# Patient Record
Sex: Male | Born: 1960
Health system: Southern US, Community
[De-identification: ages and names within clinical notes are randomized; demographics above are authoritative.]

## PROBLEM LIST (undated history)

## (undated) DIAGNOSIS — I4891 Unspecified atrial fibrillation: Secondary | ICD-10-CM

## (undated) DIAGNOSIS — B009 Herpesviral infection, unspecified: Secondary | ICD-10-CM

## (undated) DIAGNOSIS — E785 Hyperlipidemia, unspecified: Secondary | ICD-10-CM

## (undated) HISTORY — PX: JOINT REPLACEMENT: SHX530

## (undated) HISTORY — PX: OTHER SURGICAL HISTORY: SHX169

## (undated) HISTORY — DX: Hyperlipidemia, unspecified: E78.5

## (undated) HISTORY — DX: Unspecified atrial fibrillation: I48.91

## (undated) HISTORY — DX: Herpesviral infection, unspecified: B00.9

## (undated) HISTORY — PX: ELBOW SURGERY: SHX618

## (undated) HISTORY — PX: TOTAL HIP ARTHROPLASTY: SHX124

---

## 2007-02-21 ENCOUNTER — Ambulatory Visit: Payer: Self-pay | Admitting: Unknown Physician Specialty

## 2007-05-13 ENCOUNTER — Ambulatory Visit: Payer: Self-pay | Admitting: General Practice

## 2007-05-23 ENCOUNTER — Ambulatory Visit: Payer: Self-pay | Admitting: General Practice

## 2009-01-11 ENCOUNTER — Ambulatory Visit: Payer: Self-pay | Admitting: General Practice

## 2010-10-27 ENCOUNTER — Ambulatory Visit: Payer: Self-pay | Admitting: General Practice

## 2011-08-16 ENCOUNTER — Ambulatory Visit: Payer: Self-pay | Admitting: General Practice

## 2012-05-31 ENCOUNTER — Ambulatory Visit: Payer: Self-pay | Admitting: General Practice

## 2012-06-02 ENCOUNTER — Ambulatory Visit: Payer: Self-pay | Admitting: General Practice

## 2013-04-06 DIAGNOSIS — M898X9 Other specified disorders of bone, unspecified site: Secondary | ICD-10-CM | POA: Insufficient documentation

## 2013-05-08 ENCOUNTER — Emergency Department: Payer: Self-pay | Admitting: Emergency Medicine

## 2013-05-08 LAB — BASIC METABOLIC PANEL
Anion Gap: 6 — ABNORMAL LOW (ref 7–16)
BUN: 15 mg/dL (ref 7–18)
Chloride: 107 mmol/L (ref 98–107)
Co2: 28 mmol/L (ref 21–32)
Creatinine: 1.35 mg/dL — ABNORMAL HIGH (ref 0.60–1.30)
Glucose: 110 mg/dL — ABNORMAL HIGH (ref 65–99)
Osmolality: 283 (ref 275–301)
Potassium: 3.3 mmol/L — ABNORMAL LOW (ref 3.5–5.1)
Sodium: 141 mmol/L (ref 136–145)

## 2013-05-08 LAB — CBC
HCT: 40.5 % (ref 40.0–52.0)
MCH: 31 pg (ref 26.0–34.0)
MCHC: 35.8 g/dL (ref 32.0–36.0)
MCV: 87 fL (ref 80–100)
RDW: 13.5 % (ref 11.5–14.5)
WBC: 8.9 10*3/uL (ref 3.8–10.6)

## 2013-05-08 LAB — TROPONIN I
Troponin-I: <0.02 ng/mL
Troponin-I: <0.02 ng/mL

## 2013-08-28 ENCOUNTER — Ambulatory Visit: Payer: Self-pay | Admitting: General Practice

## 2013-09-04 DIAGNOSIS — M179 Osteoarthritis of knee, unspecified: Secondary | ICD-10-CM | POA: Insufficient documentation

## 2013-12-23 ENCOUNTER — Emergency Department: Payer: Self-pay | Admitting: Emergency Medicine

## 2013-12-23 LAB — CK TOTAL AND CKMB (NOT AT ARMC)
CK, Total: 298 U/L
CK-MB: 1.5 ng/mL (ref 0.5–3.6)

## 2013-12-23 LAB — PROTIME-INR
INR: 0.8
Prothrombin Time: 11.5 secs (ref 11.5–14.7)

## 2013-12-23 LAB — BASIC METABOLIC PANEL
Anion Gap: 4 — ABNORMAL LOW (ref 7–16)
BUN: 14 mg/dL (ref 7–18)
Calcium, Total: 8.2 mg/dL — ABNORMAL LOW (ref 8.5–10.1)
Chloride: 111 mmol/L — ABNORMAL HIGH (ref 98–107)
Co2: 25 mmol/L (ref 21–32)
Creatinine: 1.16 mg/dL (ref 0.60–1.30)
EGFR (African American): >60
EGFR (Non-African Amer.): >60
Glucose: 120 mg/dL — ABNORMAL HIGH (ref 65–99)
Osmolality: 281 (ref 275–301)
Potassium: 4.1 mmol/L (ref 3.5–5.1)
Sodium: 140 mmol/L (ref 136–145)

## 2013-12-23 LAB — CBC
HCT: 44.9 % (ref 40.0–52.0)
HGB: 15.5 g/dL (ref 13.0–18.0)
MCH: 30.7 pg (ref 26.0–34.0)
MCHC: 34.5 g/dL (ref 32.0–36.0)
MCV: 89 fL (ref 80–100)
PLATELETS: 176 10*3/uL (ref 150–440)
RBC: 5.05 10*6/uL (ref 4.40–5.90)
RDW: 13.4 % (ref 11.5–14.5)
WBC: 6.5 10*3/uL (ref 3.8–10.6)

## 2013-12-23 LAB — TROPONIN I
Troponin-I: <0.02 ng/mL
Troponin-I: <0.02 ng/mL

## 2013-12-28 ENCOUNTER — Emergency Department: Payer: Self-pay | Admitting: Emergency Medicine

## 2013-12-28 LAB — CBC
HCT: 44.5 % (ref 40.0–52.0)
HGB: 15.6 g/dL (ref 13.0–18.0)
MCH: 30.5 pg (ref 26.0–34.0)
MCHC: 35.2 g/dL (ref 32.0–36.0)
MCV: 87 fL (ref 80–100)
PLATELETS: 191 10*3/uL (ref 150–440)
RBC: 5.12 10*6/uL (ref 4.40–5.90)
RDW: 13.5 % (ref 11.5–14.5)
WBC: 8.1 10*3/uL (ref 3.8–10.6)

## 2013-12-28 LAB — BASIC METABOLIC PANEL
ANION GAP: 7 (ref 7–16)
BUN: 15 mg/dL (ref 7–18)
CHLORIDE: 105 mmol/L (ref 98–107)
CREATININE: 1.38 mg/dL — AB (ref 0.60–1.30)
Calcium, Total: 9 mg/dL (ref 8.5–10.1)
Co2: 25 mmol/L (ref 21–32)
EGFR (African American): >60
GFR CALC NON AF AMER: 59 — AB
GLUCOSE: 115 mg/dL — AB (ref 65–99)
Osmolality: 276 (ref 275–301)
Potassium: 3.5 mmol/L (ref 3.5–5.1)
SODIUM: 137 mmol/L (ref 136–145)

## 2013-12-28 LAB — TROPONIN I: Troponin-I: <0.02 ng/mL

## 2013-12-30 ENCOUNTER — Emergency Department: Payer: Self-pay | Admitting: Emergency Medicine

## 2013-12-30 LAB — TROPONIN I: Troponin-I: <0.02 ng/mL

## 2013-12-30 LAB — CBC
HCT: 45.3 % (ref 40.0–52.0)
HGB: 15.7 g/dL (ref 13.0–18.0)
MCH: 30.3 pg (ref 26.0–34.0)
MCHC: 34.8 g/dL (ref 32.0–36.0)
MCV: 87 fL (ref 80–100)
Platelet: 204 10*3/uL (ref 150–440)
RBC: 5.2 10*6/uL (ref 4.40–5.90)
RDW: 13.6 % (ref 11.5–14.5)
WBC: 7.4 10*3/uL (ref 3.8–10.6)

## 2013-12-30 LAB — COMPREHENSIVE METABOLIC PANEL
ALT: 14 U/L (ref 12–78)
ANION GAP: 5 — AB (ref 7–16)
AST: 21 U/L (ref 15–37)
Albumin: 3.8 g/dL (ref 3.4–5.0)
Alkaline Phosphatase: 78 U/L
BUN: 12 mg/dL (ref 7–18)
Bilirubin,Total: 0.5 mg/dL (ref 0.2–1.0)
CHLORIDE: 108 mmol/L — AB (ref 98–107)
Calcium, Total: 8.2 mg/dL — ABNORMAL LOW (ref 8.5–10.1)
Co2: 25 mmol/L (ref 21–32)
Creatinine: 1.16 mg/dL (ref 0.60–1.30)
EGFR (African American): >60
EGFR (Non-African Amer.): >60
Glucose: 105 mg/dL — ABNORMAL HIGH (ref 65–99)
Osmolality: 276 (ref 275–301)
Potassium: 3.6 mmol/L (ref 3.5–5.1)
Sodium: 138 mmol/L (ref 136–145)
Total Protein: 7.5 g/dL (ref 6.4–8.2)

## 2013-12-30 LAB — LIPASE, BLOOD: Lipase: 264 U/L (ref 73–393)

## 2013-12-30 LAB — MAGNESIUM: MAGNESIUM: 1.6 mg/dL — AB

## 2014-01-16 ENCOUNTER — Ambulatory Visit: Payer: Self-pay | Admitting: Gastroenterology

## 2014-01-20 LAB — PATHOLOGY REPORT

## 2014-02-21 ENCOUNTER — Ambulatory Visit: Payer: Self-pay | Admitting: Gastroenterology

## 2015-05-08 ENCOUNTER — Other Ambulatory Visit: Payer: Self-pay | Admitting: Physician Assistant

## 2015-05-08 DIAGNOSIS — R1903 Right lower quadrant abdominal swelling, mass and lump: Secondary | ICD-10-CM

## 2015-05-14 ENCOUNTER — Ambulatory Visit
Admission: RE | Admit: 2015-05-14 | Discharge: 2015-05-14 | Disposition: A | Discharge status: Home / Self Care | Payer: BLUE CROSS/BLUE SHIELD | Source: Ambulatory Visit | Attending: Physician Assistant | Admitting: Physician Assistant

## 2015-05-14 DIAGNOSIS — R1903 Right lower quadrant abdominal swelling, mass and lump: Secondary | ICD-10-CM | POA: Insufficient documentation

## 2015-05-17 IMAGING — CT CT ANGIO CHEST
2 of 6 series · 18 of 36 positions shown · IV contrast (APPLIED)
Comparison: Portable chest dated 12/23/2013.

CLINICAL DATA: Chest tightness and shortness of breath.

EXAM:
CT ANGIOGRAPHY CHEST WITH CONTRAST
TECHNIQUE: Multidetector CT imaging of the chest was performed using the
standard protocol during bolus administration of intravenous
contrast. Multiplanar CT image reconstructions and MIPs were
obtained to evaluate the vascular anatomy.
CONTRAST:  100 cc Isovue 370

[Series 5: pe 1.0 thins · axial · 0.79mm/px · z∈[-316,-108]mm · 17 of 233 slices shown]
[im 13/233  lung]
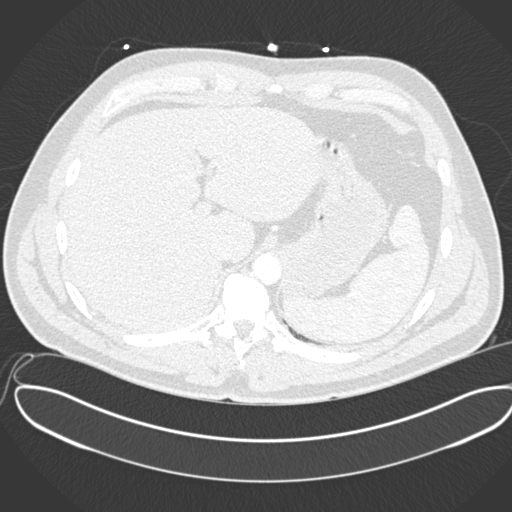
[im 26/233  mediastinal]
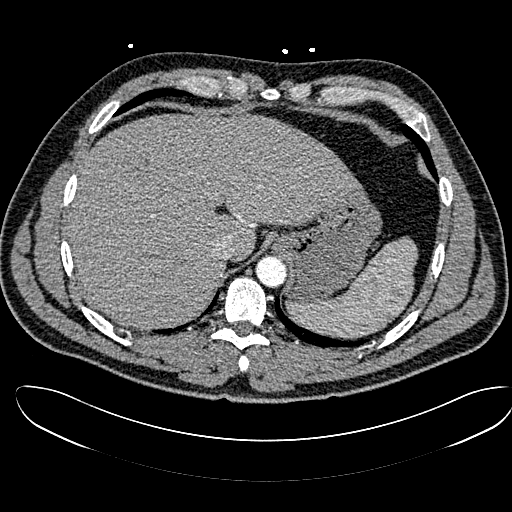
[im 39/233  lung]
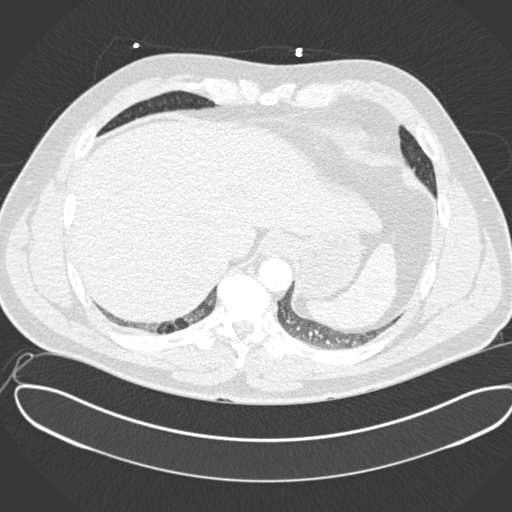
[im 52/233  mediastinal]
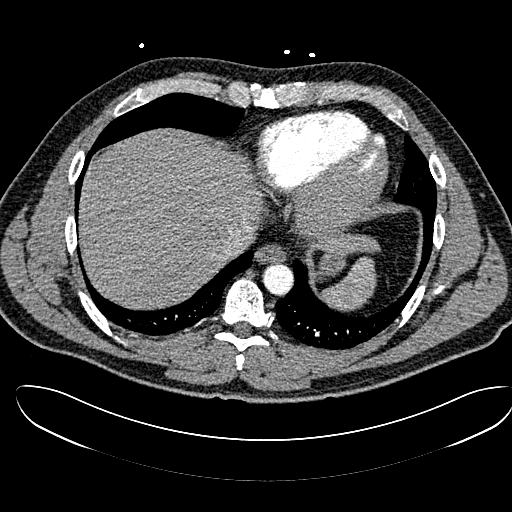
[im 65/233  lung]
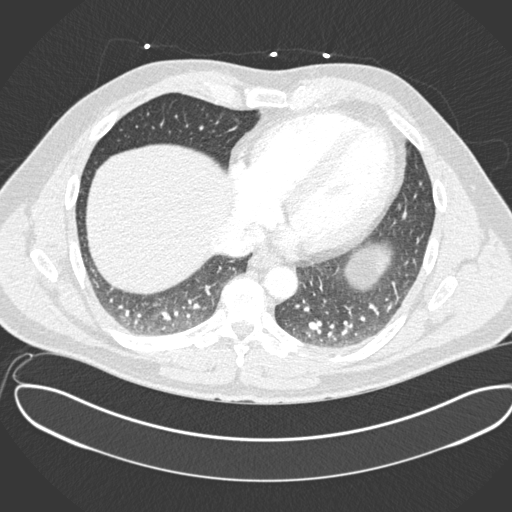
[im 78/233  mediastinal]
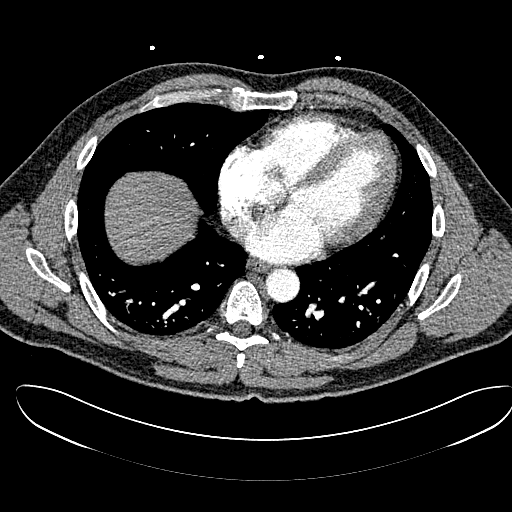
[im 91/233  lung]
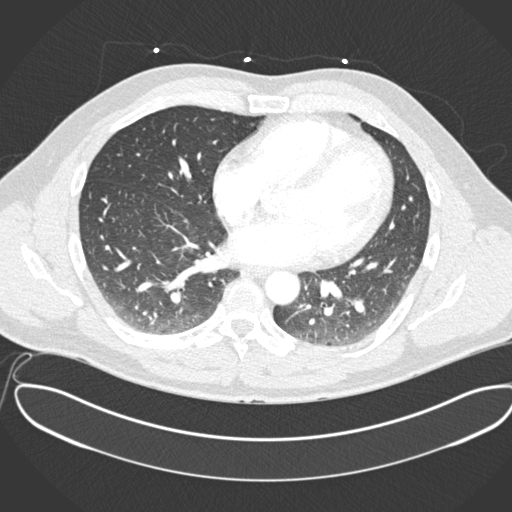
[im 104/233  mediastinal]
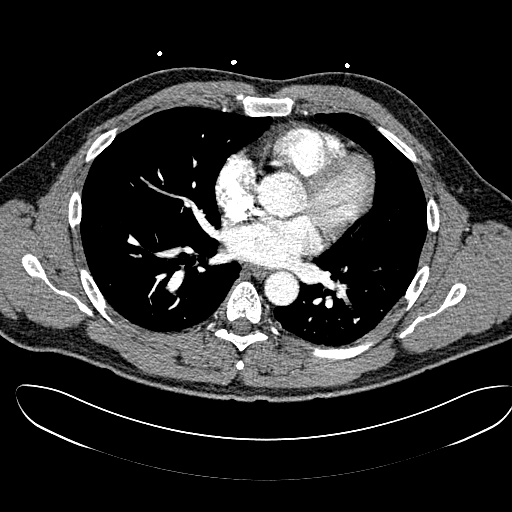
[im 117/233  lung]
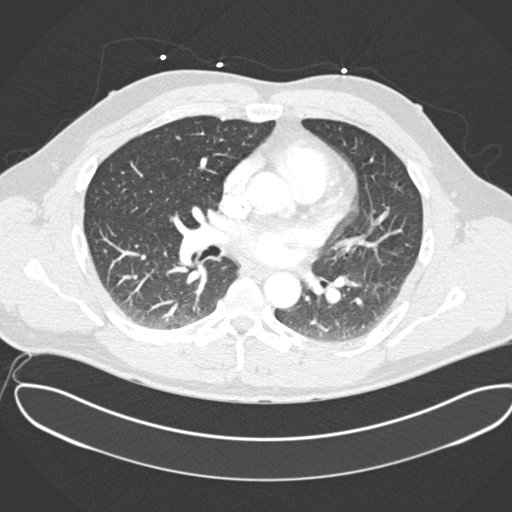
[im 129/233  mediastinal]
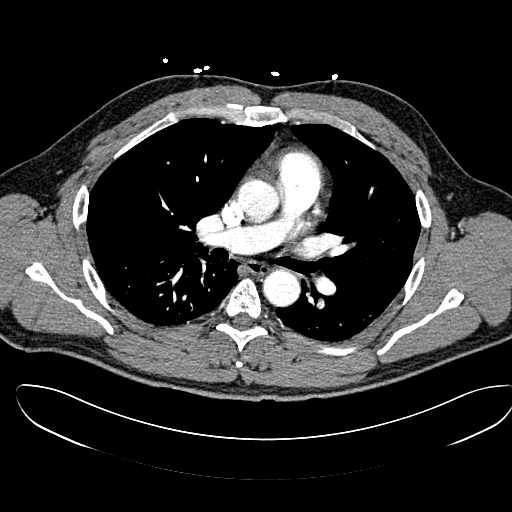
[im 142/233  lung]
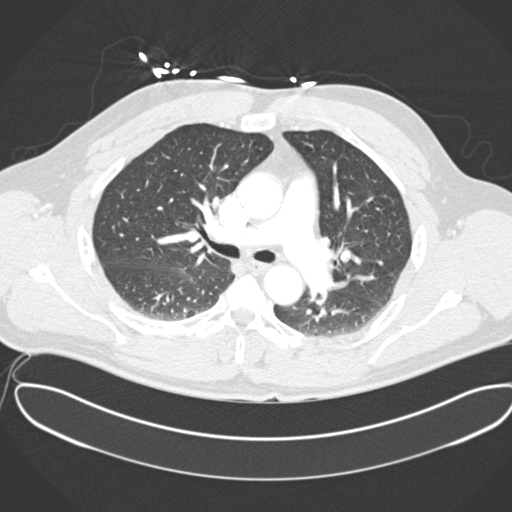
[im 155/233  mediastinal]
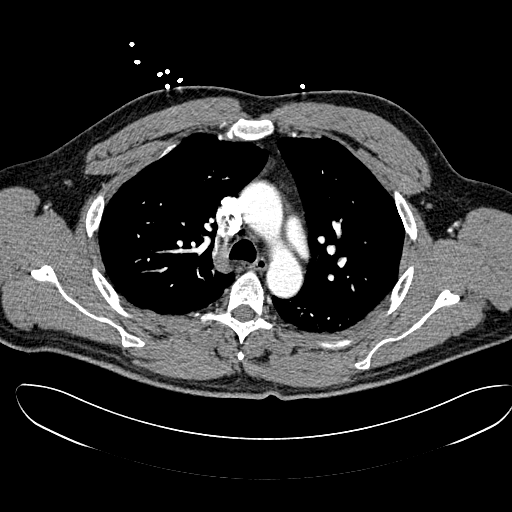
[im 168/233  lung]
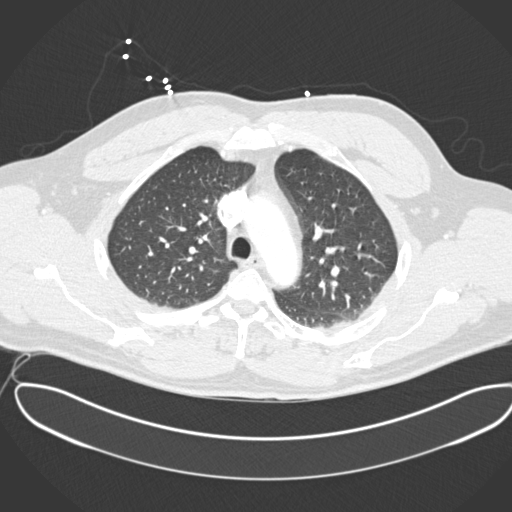
[im 181/233  mediastinal]
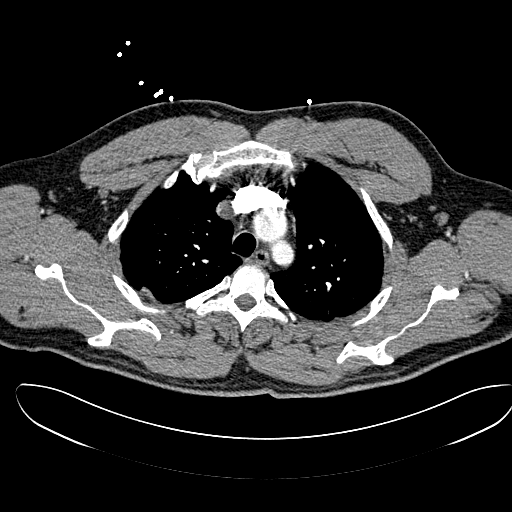
[im 194/233  lung]
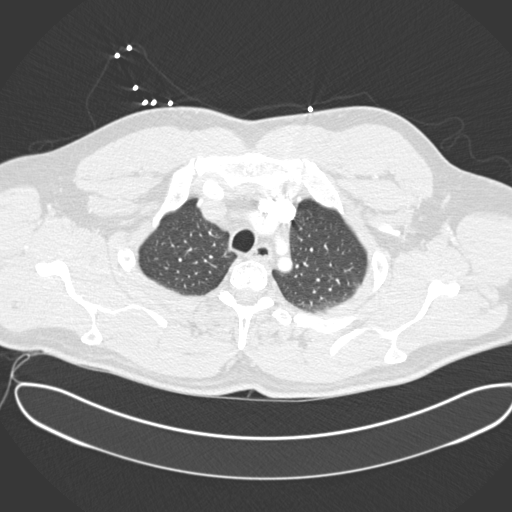
[im 207/233  mediastinal]
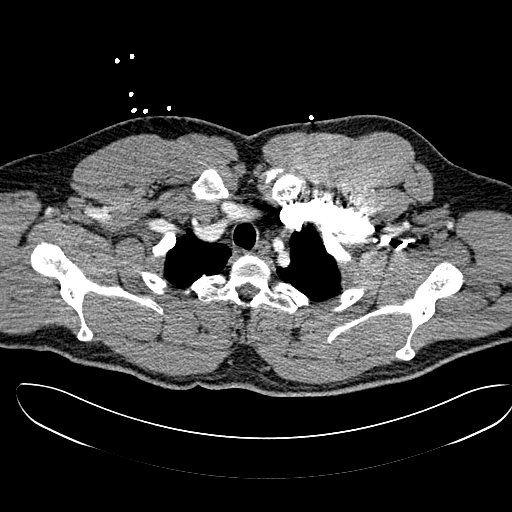
[im 220/233  lung]
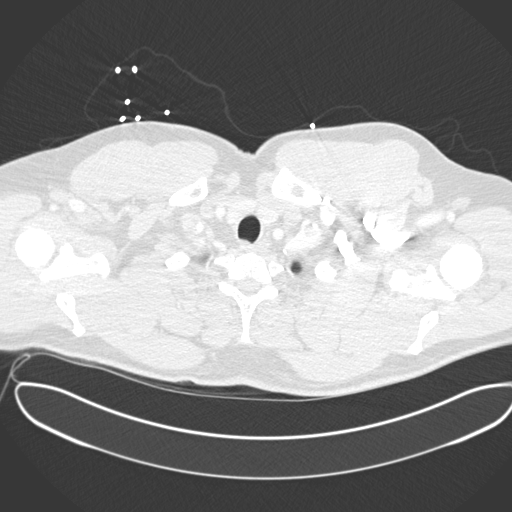

[Series 7: cor pe 2.0 mpr · coronal · 0.47mm/px · 1 of 123 slices shown]
[im 62/123  mediastinal]
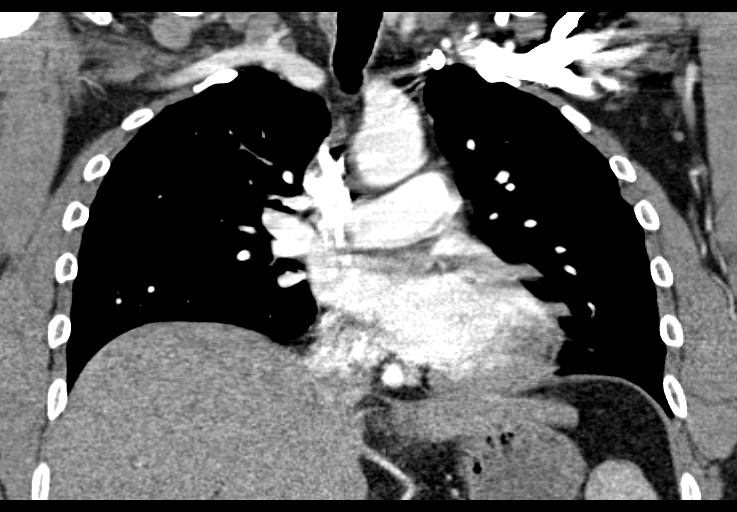

[18 of 36 positions shown; findings below may reference images not displayed]

FINDINGS: Normally opacified pulmonary arteries with no pulmonary arterial
filling defects seen. Minimal bilateral dependent atelectasis and
mild bullous changes. No lung nodules or enlarged lymph nodes. Mild
thoracic spine degenerative changes. Unremarkable upper abdomen.

Review of the MIP images confirms the above findings.
IMPRESSION: 1. No pulmonary emboli or acute abnormality.
2. Minimal bilateral dependent atelectasis and mild bullous changes.

## 2016-05-26 ENCOUNTER — Ambulatory Visit
Admission: RE | Admit: 2016-05-26 | Discharge: 2016-05-26 | Disposition: A | Discharge status: Home / Self Care | Payer: BLUE CROSS/BLUE SHIELD | Source: Ambulatory Visit | Attending: Gastroenterology | Admitting: Gastroenterology

## 2016-05-26 ENCOUNTER — Ambulatory Visit: Payer: BLUE CROSS/BLUE SHIELD | Admitting: Anesthesiology

## 2016-05-26 ENCOUNTER — Encounter: Payer: Self-pay | Admitting: *Deleted

## 2016-05-26 ENCOUNTER — Encounter
Admission: RE | Disposition: A | Discharge status: Home / Self Care | Payer: Self-pay | Source: Ambulatory Visit | Attending: Gastroenterology

## 2016-05-26 DIAGNOSIS — Z79899 Other long term (current) drug therapy: Secondary | ICD-10-CM | POA: Diagnosis not present

## 2016-05-26 DIAGNOSIS — K640 First degree hemorrhoids: Secondary | ICD-10-CM | POA: Insufficient documentation

## 2016-05-26 DIAGNOSIS — K621 Rectal polyp: Secondary | ICD-10-CM | POA: Diagnosis not present

## 2016-05-26 DIAGNOSIS — Z7982 Long term (current) use of aspirin: Secondary | ICD-10-CM | POA: Diagnosis not present

## 2016-05-26 DIAGNOSIS — D122 Benign neoplasm of ascending colon: Secondary | ICD-10-CM | POA: Diagnosis not present

## 2016-05-26 DIAGNOSIS — Z1211 Encounter for screening for malignant neoplasm of colon: Secondary | ICD-10-CM | POA: Diagnosis present

## 2016-05-26 DIAGNOSIS — K5730 Diverticulosis of large intestine without perforation or abscess without bleeding: Secondary | ICD-10-CM | POA: Insufficient documentation

## 2016-05-26 HISTORY — PX: COLONOSCOPY WITH PROPOFOL: SHX5780

## 2016-05-26 SURGERY — COLONOSCOPY WITH PROPOFOL
Anesthesia: General

## 2016-05-26 MED ORDER — LIDOCAINE HCL (CARDIAC) 20 MG/ML IV SOLN
INTRAVENOUS | Status: DC | PRN
  Administered 2016-05-26: 30 mg via INTRAVENOUS

## 2016-05-26 MED ORDER — SODIUM CHLORIDE 0.9 % IV SOLN
INTRAVENOUS | Status: DC
  Administered 2016-05-26: 1000 mL via INTRAVENOUS
  Administered 2016-05-26: 16:00:00 via INTRAVENOUS

## 2016-05-26 MED ORDER — PROPOFOL 10 MG/ML IV BOLUS
INTRAVENOUS | Status: DC | PRN
  Administered 2016-05-26: 70 mg via INTRAVENOUS

## 2016-05-26 MED ORDER — SODIUM CHLORIDE 0.9 % IV SOLN: INTRAVENOUS | Status: DC

## 2016-05-26 MED ORDER — PROPOFOL 500 MG/50ML IV EMUL
INTRAVENOUS | Status: DC | PRN
  Administered 2016-05-26: 120 ug/kg/min via INTRAVENOUS

## 2016-05-26 MED ORDER — MIDAZOLAM HCL 2 MG/2ML IJ SOLN
INTRAMUSCULAR | Status: DC | PRN
  Administered 2016-05-26: 1 mg via INTRAVENOUS

## 2016-05-26 NOTE — H&P (Signed)
Outpatient short stay form Pre-procedure 05/26/2016 3:49 PM Jesus Sails MD  Primary Physician: Dr. Melanie Crazier  Reason for visit:  Colonoscopy  History of present illness:  Patient is a 55 year old male presenting today as above. There is a family history of colon polyps in both mother and father. Patient had a colonoscopy in 2008 showing no polyps at that time however multiple diverticuli as well as internal hemorrhoids. He tolerated his prep well. He does take 81 mg aspirin that has been held for several days. He takes no blood thinning agents or other aspirin products.    Current Facility-Administered Medications:  .  0.9 %  sodium chloride infusion, , Intravenous, Continuous, Jesus Sails, MD, Last Rate: 20 mL/hr at 05/26/16 1456 .  0.9 %  sodium chloride infusion, , Intravenous, Continuous, Jesus Sails, MD  Prescriptions Prior to Admission  Medication Sig Dispense Refill Last Dose  . aspirin EC 81 MG tablet Take 81 mg by mouth daily.   Past Week at Unknown time  . Cholecalciferol 10000 units TABS Take 1 tablet by mouth.   Past Week at Unknown time  . glucosamine-chondroitin 500-400 MG tablet Take 1 tablet by mouth 3 (three) times daily.   Past Week at Unknown time  . meloxicam (MOBIC) 15 MG tablet Take 15 mg by mouth daily.   Past Week at Unknown time  . MULTIPLE VITAMINS PO Take 1 tablet by mouth daily.   Past Week at Unknown time  . omega-3 acid ethyl esters (LOVAZA) 1 g capsule Take 1 g by mouth daily.   Past Week at Unknown time  . aspirin 81 MG chewable tablet Chew 81 mg by mouth daily.   Not Taking at Unknown time     No Known Allergies   History reviewed. No pertinent past medical history.  Review of systems:      Physical Exam    Heart and lungs: Regular rate and rhythm without rub or gallop, lungs are bilaterally clear.    HEENT: Normocephalic atraumatic eyes are anicteric    Other:     Pertinant exam for procedure: Soft nontender  nondistended bowel sounds positive normoactive.    Planned proceedures: Colonoscopy and indicated procedures.    Jesus Sails, MD Gastroenterology 05/26/2016  3:49 PM

## 2016-05-26 NOTE — Anesthesia Postprocedure Evaluation (Signed)
Anesthesia Post Note  Patient: Jesus Kent  Procedure(s) Performed: Procedure(s) (LRB): COLONOSCOPY WITH PROPOFOL (N/A)  Patient location during evaluation: PACU Anesthesia Type: General Level of consciousness: awake and alert and oriented Pain management: pain level controlled Vital Signs Assessment: post-procedure vital signs reviewed and stable Respiratory status: spontaneous breathing Cardiovascular status: blood pressure returned to baseline Anesthetic complications: no    Last Vitals:  Vitals:   05/26/16 1650 05/26/16 1700  BP: 112/83 116/78  Pulse: (!) 59 (!) 53  Resp: 16 12  Temp:      Last Pain:  Vitals:   05/26/16 1629  TempSrc: Tympanic                 Sally-Ann Cutbirth

## 2016-05-26 NOTE — Transfer of Care (Signed)
Immediate Anesthesia Transfer of Care Note  Patient: Jesus Kent  Procedure(s) Performed: Procedure(s): COLONOSCOPY WITH PROPOFOL (N/A)  Patient Location: PACU  Anesthesia Type:General  Level of Consciousness: sedated  Airway & Oxygen Therapy: Patient Spontanous Breathing and Patient connected to nasal cannula oxygen  Post-op Assessment: Report given to RN and Post -op Vital signs reviewed and stable  Post vital signs: Reviewed and stable  Last Vitals:  Vitals:   05/26/16 1439 05/26/16 1629  BP: (!) 142/97 92/61  Pulse: 80 79  Resp: 20 18  Temp: 36.3 C (!) (P) 35.9 C    Last Pain:  Vitals:   05/26/16 1629  TempSrc: (P) Tympanic         Complications: No apparent anesthesia complications

## 2016-05-26 NOTE — Anesthesia Preprocedure Evaluation (Signed)
Anesthesia Evaluation  Patient identified by MRN, date of birth, ID band Patient awake    Reviewed: Allergy & Precautions, NPO status , Patient's Chart, lab work & pertinent test results  History of Anesthesia Complications Negative for: history of anesthetic complications  Airway Mallampati: I  TM Distance: >3 FB Neck ROM: Full    Dental no notable dental hx.    Pulmonary neg pulmonary ROS, neg sleep apnea, neg COPD,    breath sounds clear to auscultation- rhonchi (-) wheezing      Cardiovascular Exercise Tolerance: Good (-) hypertension(-) CAD and (-) Past MI  Rhythm:Regular Rate:Normal - Systolic murmurs and - Diastolic murmurs    Neuro/Psych negative neurological ROS  negative psych ROS   GI/Hepatic negative GI ROS, Neg liver ROS,   Endo/Other  negative endocrine ROSneg diabetes  Renal/GU negative Renal ROS     Musculoskeletal negative musculoskeletal ROS (+)   Abdominal (+) + obese,   Peds  Hematology negative hematology ROS (+)   Anesthesia Other Findings   Reproductive/Obstetrics                             Anesthesia Physical Anesthesia Plan  ASA: II  Anesthesia Plan: General   Post-op Pain Management:    Induction: Intravenous  Airway Management Planned: Natural Airway  Additional Equipment:   Intra-op Plan:   Post-operative Plan:   Informed Consent: I have reviewed the patients History and Physical, chart, labs and discussed the procedure including the risks, benefits and alternatives for the proposed anesthesia with the patient or authorized representative who has indicated his/her understanding and acceptance.   Dental advisory given  Plan Discussed with: CRNA and Anesthesiologist  Anesthesia Plan Comments:         Anesthesia Quick Evaluation

## 2016-05-26 NOTE — Op Note (Signed)
[facility] Gastroenterology Patient Name: Jesus Kent Procedure Date: 05/26/2016 3:49 PM MRN: [id number] Account #: [id number] Date of Birth: [birth year] Admit Type: Outpatient Age: 55 Room: [facility] ENDO ROOM 3 Gender: Male Note Status: Finalized Procedure:            Colonoscopy Indications:          Family history of colonic polyps in a first-degree                        relative Providers:            [name], MD Referring MD:         Amada Jupiter. Carollee Herter (Referring MD) Medicines:            Monitored Anesthesia Care Complications:        No immediate complications. Procedure:            Pre-Anesthesia Assessment:                       - ASA Grade Assessment: II - A patient with mild                        systemic disease.                       After obtaining informed consent, the colonoscope was                        passed under direct vision. Throughout the procedure,                        the patient's blood pressure, pulse, and oxygen                        saturations were monitored continuously. The                        Colonoscope was introduced through the anus and                        advanced to the the cecum, identified by appendiceal                        orifice and ileocecal valve. The colonoscopy was                        performed without difficulty. The patient tolerated the                        procedure well. The quality of the bowel preparation                        was good. Findings:      A 3 mm polyp was found in the ascending colon. The polyp was flat. The       polyp was removed with a cold biopsy forceps. Resection and retrieval       were complete.      A less than 1 mm polyp was found in the rectum. The polyp was sessile.       The polyp was removed with a cold biopsy forceps. Resection and  retrieval were complete.      Multiple small and large-mouthed diverticula were found in the sigmoid   colon, descending colon, transverse colon and ascending colon.      Non-bleeding internal hemorrhoids were found during retroflexion and       during anoscopy. The hemorrhoids were medium-sized and Grade I (internal       hemorrhoids that do not prolapse). Impression:           - One 3 mm polyp in the ascending colon, removed with a                        cold biopsy forceps. Resected and retrieved.                       - One less than 1 mm polyp in the rectum, removed with                        a cold biopsy forceps. Resected and retrieved.                       - Diverticulosis in the sigmoid colon, in the                        descending colon, in the transverse colon and in the                        ascending colon. Recommendation:       - Discharge patient to home.                       - Telephone GI clinic for pathology results in 1 week. Procedure Code(s):    --- Professional ---                       [phone], Colonoscopy, flexible; with biopsy, single or                        multiple Diagnosis Code(s):    --- Professional ---                       D12.2, Benign neoplasm of ascending colon                       K62.1, Rectal polyp                       Z83.71, Family history of colonic polyps                       K57.30, Diverticulosis of large intestine without                        perforation or abscess without bleeding CPT copyright 2016 American Medical Association. All rights reserved. The codes documented in this report are preliminary and upon coder review may  be revised to meet current compliance requirements. [name], MD 05/26/2016 4:27:56 PM This report has been signed electronically. Number of Addenda: 0 Note Initiated On: 05/26/2016 3:49 PM Scope Withdrawal Time: 0 hours 15 minutes 9 seconds  Total Procedure Duration: 0 hours 26 minutes 1 second       [employer]  [facility]

## 2016-05-27 ENCOUNTER — Encounter: Payer: Self-pay | Admitting: Gastroenterology

## 2016-05-28 LAB — SURGICAL PATHOLOGY

## 2017-03-16 DIAGNOSIS — Z09 Encounter for follow-up examination after completed treatment for conditions other than malignant neoplasm: Secondary | ICD-10-CM | POA: Insufficient documentation

## 2017-04-20 DIAGNOSIS — E669 Obesity, unspecified: Secondary | ICD-10-CM | POA: Insufficient documentation

## 2017-05-28 DIAGNOSIS — Z96641 Presence of right artificial hip joint: Secondary | ICD-10-CM | POA: Insufficient documentation

## 2017-10-07 DIAGNOSIS — M25522 Pain in left elbow: Secondary | ICD-10-CM | POA: Insufficient documentation

## 2017-12-29 ENCOUNTER — Emergency Department: Payer: BLUE CROSS/BLUE SHIELD

## 2017-12-29 ENCOUNTER — Other Ambulatory Visit: Payer: Self-pay

## 2017-12-29 ENCOUNTER — Emergency Department
Admission: EM | Admit: 2017-12-29 | Discharge: 2017-12-29 | Disposition: A | Discharge status: Home / Self Care | Payer: BLUE CROSS/BLUE SHIELD | Attending: Emergency Medicine | Admitting: Emergency Medicine

## 2017-12-29 ENCOUNTER — Encounter: Payer: Self-pay | Admitting: Emergency Medicine

## 2017-12-29 DIAGNOSIS — I4891 Unspecified atrial fibrillation: Secondary | ICD-10-CM

## 2017-12-29 DIAGNOSIS — Z96649 Presence of unspecified artificial hip joint: Secondary | ICD-10-CM | POA: Diagnosis not present

## 2017-12-29 DIAGNOSIS — Z96651 Presence of right artificial knee joint: Secondary | ICD-10-CM | POA: Diagnosis not present

## 2017-12-29 DIAGNOSIS — R000 Tachycardia, unspecified: Secondary | ICD-10-CM | POA: Diagnosis present

## 2017-12-29 LAB — BASIC METABOLIC PANEL
ANION GAP: 8 (ref 5–15)
BUN: 16 mg/dL (ref 6–20)
CALCIUM: 8.9 mg/dL (ref 8.9–10.3)
CO2: 25 mmol/L (ref 22–32)
Chloride: 106 mmol/L (ref 101–111)
Creatinine, Ser: 0.95 mg/dL (ref 0.61–1.24)
GFR calc Af Amer: >60 mL/min (ref >60)
GLUCOSE: 109 mg/dL — AB (ref 65–99)
POTASSIUM: 3.7 mmol/L (ref 3.5–5.1)
SODIUM: 139 mmol/L (ref 135–145)

## 2017-12-29 LAB — CBC
HEMATOCRIT: 45.9 % (ref 40.0–52.0)
HEMOGLOBIN: 16 g/dL (ref 13.0–18.0)
MCH: 30.1 pg (ref 26.0–34.0)
MCHC: 34.8 g/dL (ref 32.0–36.0)
MCV: 86.5 fL (ref 80.0–100.0)
Platelets: 219 10*3/uL (ref 150–440)
RBC: 5.31 MIL/uL (ref 4.40–5.90)
RDW: 13.8 % (ref 11.5–14.5)
WBC: 5.9 10*3/uL (ref 3.8–10.6)

## 2017-12-29 LAB — PROTIME-INR
INR: 0.84
Prothrombin Time: 11.4 seconds (ref 11.4–15.2)

## 2017-12-29 LAB — TROPONIN I

## 2017-12-29 LAB — TSH: TSH: 2.957 u[IU]/mL (ref 0.350–4.500)

## 2017-12-29 LAB — APTT: APTT: 30 s (ref 24–36)

## 2017-12-29 MED ORDER — ASPIRIN EC 325 MG PO TBEC: 325.0000 mg | DELAYED_RELEASE_TABLET | Freq: Every day | ORAL | 3 refills | Status: AC

## 2017-12-29 MED ORDER — DILTIAZEM HCL ER COATED BEADS 240 MG PO CP24: 240.0000 mg | ORAL_CAPSULE | Freq: Every day | ORAL | 11 refills | Status: DC

## 2017-12-29 MED ORDER — DILTIAZEM HCL 25 MG/5ML IV SOLN
INTRAVENOUS | Status: AC
  Filled 2017-12-29: qty 5

## 2017-12-29 MED ORDER — SODIUM CHLORIDE 0.9 % IV SOLN
Freq: Once | INTRAVENOUS | Status: AC
  Administered 2017-12-29: 08:00:00 via INTRAVENOUS

## 2017-12-29 MED ORDER — DILTIAZEM HCL 60 MG PO TABS
30.0000 mg | ORAL_TABLET | Freq: Once | ORAL | Status: AC
  Administered 2017-12-29: 30 mg via ORAL
  Filled 2017-12-29: qty 1

## 2017-12-29 MED ORDER — DILTIAZEM HCL 25 MG/5ML IV SOLN
20.0000 mg | Freq: Once | INTRAVENOUS | Status: AC
  Administered 2017-12-29: 20 mg via INTRAVENOUS

## 2017-12-29 MED ORDER — ASPIRIN 81 MG PO CHEW
324.0000 mg | CHEWABLE_TABLET | Freq: Once | ORAL | Status: AC
  Administered 2017-12-29: 324 mg via ORAL
  Filled 2017-12-29: qty 4

## 2017-12-29 NOTE — ED Notes (Signed)
The EKG was completed and signed by Dr. Jimmye Norman. The EKG was also exported into the system.

## 2017-12-29 NOTE — ED Notes (Signed)
Pt discharged home after verbalizing understanding of discharge instructions; nad noted. 

## 2017-12-29 NOTE — ED Triage Notes (Signed)
Pt woke up feeling like heart rate fast. Wife hooked to him to apple watch and said was in afib.  Went to Psychologist, occupational and rate over 200 per report. Arrived POV.  No pain at this timel feels like heart rate fast.

## 2017-12-29 NOTE — ED Provider Notes (Signed)
[facility] Emergency Department Provider Note       Time seen: ----------------------------------------- 7:33 AM on 12/29/2017 -----------------------------------------  I have reviewed the triage vital signs and the nursing notes.  HISTORY   Chief Complaint Tachycardia   HPI Jesus Kent is a 57 y.o. male with no significant past medical history who presents to the ED for fast heartbeat.  Patient states that his wife hooked him up to an apple watch and said that he was in atrial fibrillation.  He went to the fire department with a rate over 200.  He really does not have complaints, can only tell that his heart is beating fast or irregularly on occasion.  He states he woke up this way and is never had this happen before.  History reviewed. No pertinent past medical history.  There are no active problems to display for this patient.   Past Surgical History:  Procedure Laterality Date  . COLONOSCOPY WITH PROPOFOL N/A 05/26/2016   Procedure: COLONOSCOPY WITH PROPOFOL;  Surgeon: [name], MD;  Location: [facility] ENDOSCOPY;  Service: Endoscopy;  Laterality: N/A;  . JOINT REPLACEMENT    . knee surgery x3 Right   . TOTAL HIP ARTHROPLASTY      Allergies Patient has no known allergies.  Social History Social History   Tobacco Use  . Smoking status: Never Smoker  . Smokeless tobacco: Former Network engineer Use Topics  . Alcohol use: Never    Frequency: Never    Comment: rare  . Drug use: No   Review of Systems Constitutional: Negative for fever. Cardiovascular: Negative for chest pain.  Positive for fast heart beat Respiratory: Negative for shortness of breath. Gastrointestinal: Negative for abdominal pain, vomiting and diarrhea. Musculoskeletal: Negative for back pain. Skin: Negative for rash. Neurological: Negative for headaches, focal weakness or numbness.  All systems negative/normal/unremarkable except as stated in the  HPI  ____________________________________________   PHYSICAL EXAM:  VITAL SIGNS: ED Triage Vitals [12/29/17 0730]  Enc Vitals Group     BP      Pulse      Resp      Temp      Temp src      SpO2      Weight 240 lb (108.9 kg)     Height 5\' 7"  (1.702 m)     Head Circumference      Peak Flow      Pain Score 0     Pain Loc      Pain Edu?      Excl. in [place]?    Constitutional: Alert and oriented. Well appearing and in no distress. Eyes: Conjunctivae are normal. Normal extraocular movements. ENT   Head: Normocephalic and atraumatic.   Nose: No congestion/rhinnorhea.   Mouth/Throat: Mucous membranes are moist.   Neck: No stridor. Cardiovascular: Normal rate, regular rhythm. No murmurs, rubs, or gallops. Respiratory: Normal respiratory effort without tachypnea nor retractions. Breath sounds are clear and equal bilaterally. No wheezes/rales/rhonchi. Gastrointestinal: Soft and nontender. Normal bowel sounds Musculoskeletal: Nontender with normal range of motion in extremities. No lower extremity tenderness nor edema. Neurologic:  Normal speech and language. No gross focal neurologic deficits are appreciated.  Skin:  Skin is warm, dry and intact. No rash noted. Psychiatric: Mood and affect are normal. Speech and behavior are normal.  ____________________________________________  EKG: Interpreted by me.  Atrial fibrillation with rate of 123 bpm, left anterior fascicular block, normal QT Repeat EKG interpreted by me, atrial fibrillation with  a rate of 92 bpm, borderline left axis deviation, normal QT, RSR pattern ____________________________________________  ED COURSE:  As part of my medical decision making, I reviewed the following data within the [place] History obtained from family if available, nursing notes, old chart and ekg, as well as notes from prior ED visits. Patient presented for atrial fibrillation with a rapid ventricular response, we will  assess with labs and imaging as indicated at this time.   Procedures ____________________________________________    CRITICAL CARE Performed by: Laurence Aly   Total critical care time: 30 minutes  Critical care time was exclusive of separately billable procedures and treating other patients.  Critical care was necessary to treat or prevent imminent or life-threatening deterioration.  Critical care was time spent personally by me on the following activities: development of treatment plan with patient and/or surrogate as well as nursing, discussions with consultants, evaluation of patient's response to treatment, examination of patient, obtaining history from patient or surrogate, ordering and performing treatments and interventions, ordering and review of laboratory studies, ordering and review of radiographic studies, pulse oximetry and re-evaluation of patient's condition.  LABS (pertinent positives/negatives)  Labs Reviewed  BASIC METABOLIC PANEL - Abnormal; Notable for the following components:      Result Value   Glucose, Bld 109 (*)    All other components within normal limits  CBC  TROPONIN I  TSH  APTT  PROTIME-INR   RADIOLOGY Images were viewed by me  Chest x-ray IMPRESSION: No edema or consolidation.  Stable cardiac silhouette. ____________________________________________  DIFFERENTIAL DIAGNOSIS   Dysrhythmia, unstable angina, electrolyte abnormality, hyperthyroidism  FINAL ASSESSMENT AND PLAN  Atrial fibrillation with a rapid ventricular response   Plan: The patient had presented for atrial fibrillation with a rapid ventricular response.  Patient did improve considerably with IV Cardizem.  Patient subsequently received oral Cardizem as well with continued rate control.  His blood pressure has been adequate.. Patient's labs did not reveal any abnormalities. Patient's imaging was also normal.  I discussed with cardiology and we have started him on  aspirin and he will be given Cardizem 240 mg to take extended release.  He can be seen at 1:30 tomorrow by the cardiologist.  He remains asymptomatic at this time.   Laurence Aly, MD   Note: This note was generated in part or whole with voice recognition software. Voice recognition is usually quite accurate but there are transcription errors that can and very often do occur. I apologize for any typographical errors that were not detected and corrected.     Earleen Newport, MD 12/29/17 [id number]

## 2018-07-20 DIAGNOSIS — R001 Bradycardia, unspecified: Secondary | ICD-10-CM | POA: Insufficient documentation

## 2019-06-15 DIAGNOSIS — Z96641 Presence of right artificial hip joint: Secondary | ICD-10-CM | POA: Diagnosis not present

## 2019-06-15 DIAGNOSIS — Z96642 Presence of left artificial hip joint: Secondary | ICD-10-CM | POA: Diagnosis not present

## 2019-06-15 DIAGNOSIS — Z09 Encounter for follow-up examination after completed treatment for conditions other than malignant neoplasm: Secondary | ICD-10-CM | POA: Diagnosis not present

## 2019-06-20 ENCOUNTER — Telehealth: Payer: Self-pay

## 2019-06-20 MED ORDER — METRONIDAZOLE 500 MG PO TABS: 500.0000 mg | ORAL_TABLET | Freq: Four times a day (QID) | ORAL | 0 refills | Status: DC

## 2019-06-20 MED ORDER — CIPROFLOXACIN HCL 500 MG PO TABS: 500.0000 mg | ORAL_TABLET | Freq: Two times a day (BID) | ORAL | 0 refills | Status: DC

## 2019-06-20 NOTE — Telephone Encounter (Signed)
S - Patient was called after message reviewed. Noted tingling yesterday in LL[place], and a little more tender when mash on it.  Had some corn this past weekend. Really careful what eats usually with the corn mixed in the rice had at [employer]. No fever, N/V. BM's fine, no blood, not loose.   His last flare of diverticulitis was in late 2018 from the chart records and I had seen him in Feb 2020 with knee discomfort. At that time he was treated with flagyl and cipro for seven days and was helpful. He noted this regimen was recommended if he was having another flare to him prior.  No Known Allergies  A/P - I felt it was a little early to rush to the seven day course of the antibiotics, and recommended he closely monitor over the next 24-48 hours remaining vigilant with his diet to avoid potential exacerbants as he usually does.   If his sx's are increasing and not lessening or resolving, he should start the abx and will send to his pharmacy today for him to have if needed. To start if getting more painful, any fever concerns especially. If his sx's do improve over this time and resolve, no need to start the antibiotics and I noted it would be best to not have been exposed to the antibiotics if not needed and he understood. Expressed some potential SE concerns with the quinolone (cipro) product as the one more concerning.  Can call if any questions or concerns over this time as well.

## 2019-06-29 DIAGNOSIS — M1712 Unilateral primary osteoarthritis, left knee: Secondary | ICD-10-CM | POA: Diagnosis not present

## 2019-07-04 DIAGNOSIS — M5416 Radiculopathy, lumbar region: Secondary | ICD-10-CM | POA: Diagnosis not present

## 2019-07-12 DIAGNOSIS — M545 Low back pain: Secondary | ICD-10-CM | POA: Diagnosis not present

## 2019-07-19 DIAGNOSIS — M545 Low back pain: Secondary | ICD-10-CM | POA: Diagnosis not present

## 2019-07-21 ENCOUNTER — Ambulatory Visit: Payer: 59

## 2019-07-21 ENCOUNTER — Other Ambulatory Visit: Payer: Self-pay

## 2019-07-21 DIAGNOSIS — Z23 Encounter for immunization: Secondary | ICD-10-CM

## 2019-07-24 DIAGNOSIS — M5416 Radiculopathy, lumbar region: Secondary | ICD-10-CM | POA: Diagnosis not present

## 2019-07-25 DIAGNOSIS — M545 Low back pain: Secondary | ICD-10-CM | POA: Diagnosis not present

## 2019-07-31 DIAGNOSIS — M545 Low back pain: Secondary | ICD-10-CM | POA: Diagnosis not present

## 2019-08-01 DIAGNOSIS — L821 Other seborrheic keratosis: Secondary | ICD-10-CM | POA: Diagnosis not present

## 2019-08-01 DIAGNOSIS — Z85828 Personal history of other malignant neoplasm of skin: Secondary | ICD-10-CM | POA: Diagnosis not present

## 2019-08-01 DIAGNOSIS — D2261 Melanocytic nevi of right upper limb, including shoulder: Secondary | ICD-10-CM | POA: Diagnosis not present

## 2019-08-01 DIAGNOSIS — Z08 Encounter for follow-up examination after completed treatment for malignant neoplasm: Secondary | ICD-10-CM | POA: Diagnosis not present

## 2019-08-01 DIAGNOSIS — D2262 Melanocytic nevi of left upper limb, including shoulder: Secondary | ICD-10-CM | POA: Diagnosis not present

## 2019-08-01 DIAGNOSIS — D2271 Melanocytic nevi of right lower limb, including hip: Secondary | ICD-10-CM | POA: Diagnosis not present

## 2019-08-01 DIAGNOSIS — L728 Other follicular cysts of the skin and subcutaneous tissue: Secondary | ICD-10-CM | POA: Diagnosis not present

## 2019-08-01 DIAGNOSIS — D225 Melanocytic nevi of trunk: Secondary | ICD-10-CM | POA: Diagnosis not present

## 2019-08-03 DIAGNOSIS — M545 Low back pain: Secondary | ICD-10-CM | POA: Diagnosis not present

## 2019-08-07 DIAGNOSIS — M545 Low back pain: Secondary | ICD-10-CM | POA: Diagnosis not present

## 2019-08-21 DIAGNOSIS — M545 Low back pain: Secondary | ICD-10-CM | POA: Diagnosis not present

## 2019-08-29 DIAGNOSIS — Z20828 Contact with and (suspected) exposure to other viral communicable diseases: Secondary | ICD-10-CM | POA: Diagnosis not present

## 2019-09-21 DIAGNOSIS — M25522 Pain in left elbow: Secondary | ICD-10-CM | POA: Diagnosis not present

## 2019-09-21 DIAGNOSIS — Z03818 Encounter for observation for suspected exposure to other biological agents ruled out: Secondary | ICD-10-CM | POA: Diagnosis not present

## 2019-10-07 DIAGNOSIS — M25522 Pain in left elbow: Secondary | ICD-10-CM | POA: Diagnosis not present

## 2019-10-09 DIAGNOSIS — I480 Paroxysmal atrial fibrillation: Secondary | ICD-10-CM | POA: Diagnosis not present

## 2019-10-09 DIAGNOSIS — E782 Mixed hyperlipidemia: Secondary | ICD-10-CM | POA: Diagnosis not present

## 2019-10-23 DIAGNOSIS — M25522 Pain in left elbow: Secondary | ICD-10-CM | POA: Diagnosis not present

## 2019-10-23 DIAGNOSIS — M13822 Other specified arthritis, left elbow: Secondary | ICD-10-CM | POA: Diagnosis not present

## 2019-10-26 ENCOUNTER — Telehealth: Payer: Self-pay

## 2019-10-26 NOTE — Telephone Encounter (Signed)
[facility] office notes dated 10/23/2019 - encounter with Roseanne Kaufman, MD.  Notes reviewed by Randel Pigg, PA-C (Interim Provider).  AMD

## 2019-11-17 DIAGNOSIS — I480 Paroxysmal atrial fibrillation: Secondary | ICD-10-CM | POA: Diagnosis not present

## 2019-11-17 DIAGNOSIS — E782 Mixed hyperlipidemia: Secondary | ICD-10-CM | POA: Diagnosis not present

## 2019-11-17 DIAGNOSIS — R001 Bradycardia, unspecified: Secondary | ICD-10-CM | POA: Diagnosis not present

## 2019-11-17 DIAGNOSIS — Z20828 Contact with and (suspected) exposure to other viral communicable diseases: Secondary | ICD-10-CM | POA: Diagnosis not present

## 2019-12-05 ENCOUNTER — Other Ambulatory Visit: Payer: Self-pay

## 2019-12-05 ENCOUNTER — Ambulatory Visit: Payer: Self-pay | Admitting: Occupational Medicine

## 2019-12-05 VITALS — BP 118/84 | HR 80 | Temp 98.6°F | Ht 72.0 in | Wt 226.6 lb

## 2019-12-05 DIAGNOSIS — U071 COVID-19: Secondary | ICD-10-CM

## 2019-12-05 NOTE — Progress Notes (Signed)
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  He presents in our onsite employer clinic today requesting paperwork to be filled out for his retirement job working as a Mudlogger.  He was diagnosed with Covid last month and has recovered.  Date of  positive test was February 12.  He has met criteria for discontinuation of quarantine in that it has been greater than 10 days from symptom onset, no fever in the last 24 hours, and symptoms improving.  He had a very mild case of Covid and never had fever at all.  He has been asymptomatic for the last 10 days.  His symptoms were only some mild sinus congestion.  Paperwork was filled out.  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

## 2019-12-10 DIAGNOSIS — I480 Paroxysmal atrial fibrillation: Secondary | ICD-10-CM | POA: Insufficient documentation

## 2019-12-10 DIAGNOSIS — E785 Hyperlipidemia, unspecified: Secondary | ICD-10-CM | POA: Insufficient documentation

## 2019-12-10 NOTE — Progress Notes (Signed)
Cardiology Office Note  Date:  12/11/2019   ID:  Jesus Kent, DOB 10-27-1960, MRN [id number]  PCP:  Patient, No Pcp Per   Chief Complaint  Patient presents with  . New Patient (Initial Visit)    Self ref for A-Fib; second opinion. Former Dr. Nehemiah Massed patient. Meds reviewed by the pt. verbally.     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, heavy weights) Previously followed by [facility] cardiology Last seen 11/2019  Lone atrial fib 2019 Went to ER, started on medication for rate control, converted to normal sinus rhythm At echocardiogram stress test, both essentially normal  Echo 01/2018 NORMAL LEFT VENTRICULAR SYSTOLIC FUNCTION WITH AN ESTIMATED EF = 55 % NORMAL RIGHT VENTRICULAR SYSTOLIC FUNCTION MILD TRICUSPID AND MITRAL VALVE INSUFFICIENCY NO VALVULAR STENOSIS MILD BIATRIAL ENLARGEMENT  No other episodes of atrial fibrillation apart from 2019  Reports weight was higher, has changed his diet, does daily exercise Weight trending downward  Does cross fit,  Denies significant shortness of breath or chest pain  CT scan chest, CT scan abdomen pelvis, no significant aortic atherosclerosis no coronary calcification Images pulled up and reviewed in detail  Discussed prior EKGs from outside cardiology office Records reviewed  EKG personally reviewed by myself on todays visit Shows normal sinus rhythm rate 81 bpm no significant ST-T wave changes     PMH:   has a past medical history of Atrial fibrillation ([place]) and Hyperlipidemia.  PSH:    Past Surgical History:  Procedure Laterality Date  . COLONOSCOPY WITH PROPOFOL N/A 05/26/2016   Procedure: COLONOSCOPY WITH PROPOFOL;  Surgeon: [name], MD;  Location: [facility] ENDOSCOPY;  Service: Endoscopy;   Laterality: N/A;  . JOINT REPLACEMENT    . knee surgery x3 Right   . TOTAL HIP ARTHROPLASTY      Current Outpatient Medications  Medication Sig Dispense Refill  . aspirin 81 MG EC tablet Take by mouth.    Marland Kitchen atorvastatin (LIPITOR) 10 MG tablet Take 10 mg by mouth daily.    . Cholecalciferol 25 MCG (1000 UT) tablet Take 1,000 Units by mouth daily.    Marland Kitchen diltiazem (TIAZAC) 120 MG 24 hr capsule Take 120 mg by mouth daily.     Marland Kitchen glucosamine-chondroitin 500-400 MG tablet Take 1 tablet by mouth 3 (three) times daily.    Javier Docker Oil 1000 MG CAPS Take by mouth as directed.    . magnesium oxide (MAG-OX) 400 MG tablet Take 400 mg by mouth daily.    . MULTIPLE VITAMINS PO Take 1 tablet by mouth daily.    . Omega-3 1000 MG CAPS Take by mouth daily.     . Turmeric 500 MG TABS Take by mouth as directed.    . Zinc Sulfate (ZINC 15 PO) Take by mouth.     No current facility-administered medications for this visit.     Allergies:   Patient has no known allergies.   Social History:  The patient  reports that he has never smoked. He has quit using smokeless tobacco. He reports that he does not drink alcohol or use drugs.   Family History:   family history includes Heart disease in his father; Hyperlipidemia in his mother.    Review of Systems: Review of Systems  Constitutional: Negative.   HENT: Negative.   Respiratory:  Negative.   Cardiovascular: Negative.   Gastrointestinal: Negative.   Musculoskeletal: Negative.   Neurological: Negative.   Psychiatric/Behavioral: Negative.   All other systems reviewed and are negative.   PHYSICAL EXAM: VS:  BP 108/72 (BP Location: Right Arm, Patient Position: Sitting, Cuff Size: Normal)   Pulse 72   Ht 6' (1.829 m)   Wt 223 lb 8 oz (101.4 kg)   SpO2 98%   BMI 30.31 kg/m  , BMI Body mass index is 30.31 kg/m. GEN: Well nourished, well developed, in no acute distress HEENT: normal Neck: no JVD, carotid bruits, or masses Cardiac: RRR; no murmurs,  rubs, or gallops,no edema  Respiratory:  clear to auscultation bilaterally, normal work of breathing GI: soft, nontender, nondistended, + BS MS: no deformity or atrophy Skin: warm and dry, no rash Neuro:  Strength and sensation are intact Psych: euthymic mood, full affect  Recent Labs: No results found for requested labs within last 8760 hours.    Lipid Panel No results found for: CHOL, HDL, LDLCALC, TRIG    Wt Readings from Last 3 Encounters:  12/11/19 223 lb 8 oz (101.4 kg)  12/05/19 226 lb 9.6 oz (102.8 kg)  12/29/17 240 lb (108.9 kg)      ASSESSMENT AND PLAN:  Problem List Items Addressed This Visit      Cardiology Problems   PAF (paroxysmal atrial fibrillation) (HCC)   Relevant Orders   EKG 12-Lead   Hyperlipidemia     Paroxysmal atrial fibrillation [place] fib, low CHADS VASC, not on anticoagulation which is appropriate -We will continue diltiazem for rate rhythm control and blood pressure Monitor heart rate given rare episodes of bradycardia  Hyperlipidemia We will defer to him whether he would like to stay on Lipitor Most recent lipid panel not available, done through the city and [employer] -We can review this lab work once available -He does not have significant aortic atherosclerosis, coronary calcification, no strong indication to be on a statin  Abnormal EKG Today's EKG appears relatively normal Discussed previous EKG findings, right bundle branch block, left anterior fascicular block -This findings were not apparent on today's EKG  Disposition:   F/U as needed, consider 12 months   Total encounter time more than 45 minutes  Greater than 50% was spent in counseling and coordination of care with the patient    Signed, Esmond Plants, M.D., Ph.D. [place], [place]

## 2019-12-11 ENCOUNTER — Other Ambulatory Visit: Payer: Self-pay

## 2019-12-11 ENCOUNTER — Encounter: Payer: Self-pay | Admitting: Cardiovascular Disease

## 2019-12-11 ENCOUNTER — Ambulatory Visit (INDEPENDENT_AMBULATORY_CARE_PROVIDER_SITE_OTHER): Payer: 59 | Admitting: Cardiovascular Disease

## 2019-12-11 DIAGNOSIS — I480 Paroxysmal atrial fibrillation: Secondary | ICD-10-CM

## 2019-12-11 DIAGNOSIS — E782 Mixed hyperlipidemia: Secondary | ICD-10-CM

## 2019-12-11 NOTE — Patient Instructions (Signed)

## 2019-12-13 DIAGNOSIS — Z23 Encounter for immunization: Secondary | ICD-10-CM | POA: Diagnosis not present

## 2020-01-15 DIAGNOSIS — Z23 Encounter for immunization: Secondary | ICD-10-CM | POA: Diagnosis not present

## 2020-02-06 DIAGNOSIS — H5203 Hypermetropia, bilateral: Secondary | ICD-10-CM | POA: Diagnosis not present

## 2020-03-27 NOTE — Progress Notes (Signed)
Scheduled to complete physical 04/03/20 with Dr. Jimmye Norman.  AMD

## 2020-03-28 ENCOUNTER — Other Ambulatory Visit: Payer: Self-pay

## 2020-03-28 ENCOUNTER — Ambulatory Visit: Payer: Self-pay

## 2020-03-28 DIAGNOSIS — Z0000 Encounter for general adult medical examination without abnormal findings: Secondary | ICD-10-CM

## 2020-03-28 LAB — POCT URINALYSIS DIPSTICK
Bilirubin, UA: NEGATIVE
Blood, UA: NEGATIVE
Glucose, UA: NEGATIVE
Ketones, UA: NEGATIVE
Leukocytes, UA: NEGATIVE
Nitrite, UA: NEGATIVE
Protein, UA: NEGATIVE
Spec Grav, UA: 1.025 (ref 1.010–1.025)
Urobilinogen, UA: 0.2 E.U./dL
pH, UA: 6 (ref 5.0–8.0)

## 2020-03-29 LAB — CMP12+LP+TP+TSH+6AC+PSA+CBC…
ALT: 33 IU/L (ref 0–44)
AST: 30 IU/L (ref 0–40)
Albumin/Globulin Ratio: 1.7 (ref 1.2–2.2)
Albumin: 4.5 g/dL (ref 3.8–4.9)
Alkaline Phosphatase: 63 IU/L (ref 48–121)
BUN/Creatinine Ratio: 17 (ref 9–20)
BUN: 17 mg/dL (ref 6–24)
Basophils Absolute: 0.1 10*3/uL (ref 0.0–0.2)
Basos: 1 %
Bilirubin Total: 0.3 mg/dL (ref 0.0–1.2)
Calcium: 9.4 mg/dL (ref 8.7–10.2)
Chloride: 106 mmol/L (ref 96–106)
Chol/HDL Ratio: 3.7 ratio (ref 0.0–5.0)
Cholesterol, Total: 147 mg/dL (ref 100–199)
Creatinine, Ser: 1.02 mg/dL (ref 0.76–1.27)
EOS (ABSOLUTE): 0.2 10*3/uL (ref 0.0–0.4)
Eos: 4 %
Estimated CHD Risk: 0.6 times avg. (ref 0.0–1.0)
Free Thyroxine Index: 1.6 (ref 1.2–4.9)
GFR calc Af Amer: 93 mL/min/{1.73_m2} (ref >59)
GFR calc non Af Amer: 81 mL/min/{1.73_m2} (ref >59)
GGT: 15 IU/L (ref 0–65)
Globulin, Total: 2.6 g/dL (ref 1.5–4.5)
Glucose: 97 mg/dL (ref 65–99)
HDL: 40 mg/dL (ref >39)
Hematocrit: 46.3 % (ref 37.5–51.0)
Hemoglobin: 16 g/dL (ref 13.0–17.7)
Immature Grans (Abs): 0 10*3/uL (ref 0.0–0.1)
Immature Granulocytes: 0 %
Iron: 83 ug/dL (ref 38–169)
LDH: 224 IU/L (ref 121–224)
LDL Chol Calc (NIH): 91 mg/dL (ref 0–99)
Lymphocytes Absolute: 1.9 10*3/uL (ref 0.7–3.1)
Lymphs: 35 %
MCH: 30.5 pg (ref 26.6–33.0)
MCHC: 34.6 g/dL (ref 31.5–35.7)
MCV: 88 fL (ref 79–97)
Monocytes Absolute: 0.6 10*3/uL (ref 0.1–0.9)
Monocytes: 12 %
Neutrophils Absolute: 2.7 10*3/uL (ref 1.4–7.0)
Neutrophils: 48 %
Phosphorus: 4.1 mg/dL (ref 2.8–4.1)
Platelets: 192 10*3/uL (ref 150–450)
Potassium: 4.5 mmol/L (ref 3.5–5.2)
Prostate Specific Ag, Serum: 1.1 ng/mL (ref 0.0–4.0)
RBC: 5.25 x10E6/uL (ref 4.14–5.80)
RDW: 12.8 % (ref 11.6–15.4)
Sodium: 141 mmol/L (ref 134–144)
T3 Uptake Ratio: 24 % (ref 24–39)
T4, Total: 6.6 ug/dL (ref 4.5–12.0)
TSH: 4.86 u[IU]/mL — ABNORMAL HIGH (ref 0.450–4.500)
Total Protein: 7.1 g/dL (ref 6.0–8.5)
Triglycerides: 83 mg/dL (ref 0–149)
Uric Acid: 5 mg/dL (ref 3.8–8.4)
VLDL Cholesterol Cal: 16 mg/dL (ref 5–40)
WBC: 5.5 10*3/uL (ref 3.4–10.8)

## 2020-04-03 ENCOUNTER — Other Ambulatory Visit: Payer: Self-pay

## 2020-04-03 ENCOUNTER — Ambulatory Visit: Payer: Self-pay | Admitting: Emergency Medicine

## 2020-04-03 ENCOUNTER — Encounter: Payer: Self-pay | Admitting: Emergency Medicine

## 2020-04-03 VITALS — BP 118/80 | HR 78 | Temp 98.1°F | Resp 14 | Ht 72.0 in | Wt 231.0 lb

## 2020-04-03 DIAGNOSIS — Z0000 Encounter for general adult medical examination without abnormal findings: Secondary | ICD-10-CM

## 2020-04-03 NOTE — Progress Notes (Signed)
ER Provider Note       Time seen: 9:20 AM    I have reviewed the vital signs and the nursing notes.  HISTORY   Chief Complaint Annual Exam    HPI Jesus Kent is a 59 y.o. male with a history of paroxysmal atrial fibrillation, hyperlipidemia who presents today for annual physical examination.  Patient denies any complaints, has no specific concerns.  Past Medical History:  Diagnosis Date  . Atrial fibrillation ([place])   . Hyperlipidemia     Past Surgical History:  Procedure Laterality Date  . COLONOSCOPY WITH PROPOFOL N/A 05/26/2016   Procedure: COLONOSCOPY WITH PROPOFOL;  Surgeon: [name], MD;  Location: [facility] ENDOSCOPY;  Service: Endoscopy;  Laterality: N/A;  . JOINT REPLACEMENT    . knee surgery x3 Right   . TOTAL HIP ARTHROPLASTY      Allergies Patient has no known allergies.   Review of Systems Constitutional: Negative for fever. Cardiovascular: Negative for chest pain. Respiratory: Negative for shortness of breath. Gastrointestinal: Negative for abdominal pain, vomiting and diarrhea. Musculoskeletal: Negative for back pain. Skin: Negative for rash. Neurological: Negative for headaches, focal weakness or numbness.  All systems negative/normal/unremarkable except as stated in the HPI  ____________________________________________   PHYSICAL EXAM:  VITAL SIGNS: Vitals:   04/03/20 0905  BP: 118/80  Pulse: 78  Resp: 14  Temp: 98.1 F (36.7 C)  SpO2: 98%    Constitutional: Alert and oriented. Well appearing and in no distress. Eyes: Conjunctivae are normal. Normal extraocular movements. ENT      Head: Normocephalic and atraumatic.      Nose: No congestion/rhinnorhea.      Mouth/Throat: Mucous membranes are moist.      Neck: No stridor. Cardiovascular: Normal rate, regular rhythm. No murmurs, rubs, or gallops. Respiratory: Normal respiratory effort without tachypnea nor retractions. Breath sounds are clear and equal bilaterally. No  wheezes/rales/rhonchi. Gastrointestinal: Soft and nontender. Normal bowel sounds Musculoskeletal: Nontender with normal range of motion in extremities. No lower extremity tenderness nor edema. Neurologic:  Normal speech and language. No gross focal neurologic deficits are appreciated.  Skin:  Skin is warm, dry and intact. No rash noted. Psychiatric: Speech and behavior are normal.  ____________________________________________  EKG: Interpreted by me.  Sinus rhythm with a rate of 76 bpm, normal PR interval, normal QRS width, normal QT  ____________________________________________   LABS (pertinent positives/negatives)  Recent Results (from the past 2160 hour(s))  CMP12+LP+TP+TSH+6AC+PSA+CBC.     Status: Abnormal   Collection Time: 03/28/20  8:24 AM  Result Value Ref Range   Glucose 97 65 - 99 mg/dL   Uric Acid 5.0 3.8 - 8.4 mg/dL    Comment:            Therapeutic target for gout patients: <6.0   BUN 17 6 - 24 mg/dL   Creatinine, Ser 1.02 0.76 - 1.27 mg/dL   GFR calc non Af Amer 81 >59 mL/min/1.73   GFR calc Af Amer 93 >59 mL/min/1.73    Comment: **[facility] currently reports eGFR in compliance with the current**   recommendations of the [employer]. [facility] will   update reporting as new guidelines are published from the NKF-ASN   Task force.    BUN/Creatinine Ratio 17 9 - 20   Sodium 141 134 - 144 mmol/L   Potassium 4.5 3.5 - 5.2 mmol/L   Chloride 106 96 - 106 mmol/L   Calcium 9.4 8.7 - 10.2 mg/dL   Phosphorus 4.1 2.8 - 4.1  mg/dL   Total Protein 7.1 6.0 - 8.5 g/dL   Albumin 4.5 3.8 - 4.9 g/dL   Globulin, Total 2.6 1.5 - 4.5 g/dL   Albumin/Globulin Ratio 1.7 1.2 - 2.2   Bilirubin Total 0.3 0.0 - 1.2 mg/dL   Alkaline Phosphatase 63 48 - 121 IU/L   LDH 224 121 - 224 IU/L   AST 30 0 - 40 IU/L   ALT 33 0 - 44 IU/L   GGT 15 0 - 65 IU/L   Iron 83 38 - 169 ug/dL   Cholesterol, Total 147 100 - 199 mg/dL   Triglycerides 83 0 - 149 mg/dL   HDL 40 >39 mg/dL   VLDL  Cholesterol Cal 16 5 - 40 mg/dL   LDL Chol Calc (NIH) 91 0 - 99 mg/dL   Chol/HDL Ratio 3.7 0.0 - 5.0 ratio    Comment:                                   T. Chol/HDL Ratio                                             Men  Women                               1/2 Avg.Risk  3.4    3.3                                   Avg.Risk  5.0    4.4                                2X Avg.Risk  9.6    7.1                                3X Avg.Risk 23.4   11.0    Estimated CHD Risk 0.6 0.0 - 1.0 times avg.    Comment: The CHD Risk is based on the T. Chol/HDL ratio. Other factors affect CHD Risk such as hypertension, smoking, diabetes, severe obesity, and family history of premature CHD.    TSH 4.860 (H) 0.450 - 4.500 uIU/mL   T4, Total 6.6 4.5 - 12.0 ug/dL   T3 Uptake Ratio 24 24 - 39 %   Free Thyroxine Index 1.6 1.2 - 4.9   Prostate Specific Ag, Serum 1.1 0.0 - 4.0 ng/mL    Comment: Roche ECLIA methodology. According to the American Urological Association, Serum PSA should decrease and remain at undetectable levels after radical prostatectomy. The AUA defines biochemical recurrence as an initial PSA value 0.2 ng/mL or greater followed by a subsequent confirmatory PSA value 0.2 ng/mL or greater. Values obtained with different assay methods or kits cannot be used interchangeably. Results cannot be interpreted as absolute evidence of the presence or absence of malignant disease.    WBC 5.5 3.4 - 10.8 x10E3/uL   RBC 5.25 4.14 - 5.80 x10E6/uL   Hemoglobin 16.0 13.0 - 17.7 g/dL   Hematocrit 46.3 37.5 - 51.0 %   MCV 88 79 - 97  fL   MCH 30.5 26.6 - 33.0 pg   MCHC 34.6 31 - 35 g/dL   RDW 12.8 11.6 - 15.4 %   Platelets 192 150 - 450 x10E3/uL   Neutrophils 48 Not Estab. %   Lymphs 35 Not Estab. %   Monocytes 12 Not Estab. %   Eos 4 Not Estab. %   Basos 1 Not Estab. %   Neutrophils Absolute 2.7 1 - 7 x10E3/uL   Lymphocytes Absolute 1.9 0 - 3 x10E3/uL   Monocytes Absolute 0.6 0 - 0 x10E3/uL   EOS  (ABSOLUTE) 0.2 0.0 - 0.4 x10E3/uL   Basophils Absolute 0.1 0 - 0 x10E3/uL   Immature Granulocytes 0 Not Estab. %   Immature Grans (Abs) 0.0 0.0 - 0.1 x10E3/uL  POCT urinalysis dipstick     Status: None   Collection Time: 03/28/20  8:56 AM  Result Value Ref Range   Color, UA Yellow    Clarity, UA Clear    Glucose, UA Negative Negative   Bilirubin, UA Negative    Ketones, UA Negative    Spec Grav, UA 1.025 1.010 - 1.025   Blood, UA Negative    pH, UA 6.0 5.0 - 8.0   Protein, UA Negative Negative   Urobilinogen, UA 0.2 0.2 or 1.0 E.U./dL   Nitrite, UA Negative    Leukocytes, UA Negative Negative   Appearance     Odor       DIFFERENTIAL DIAGNOSIS  Annual physical examination  ASSESSMENT AND PLAN  Annual physical examination   Plan: The patient had presented for annual exam. Patient's labs are overall reassuring.  There are no acute issues identified today.  He has no symptoms of hypothyroidism.  He is cleared for follow-up as scheduled.  Lenise Arena MD    Note: This note was generated in part or whole with voice recognition software. Voice recognition is usually quite accurate but there are transcription errors that can and very often do occur. I apologize for any typographical errors that were not detected and corrected.

## 2020-05-17 DIAGNOSIS — R079 Chest pain, unspecified: Secondary | ICD-10-CM | POA: Diagnosis not present

## 2020-05-17 DIAGNOSIS — I493 Ventricular premature depolarization: Secondary | ICD-10-CM | POA: Diagnosis not present

## 2020-05-17 DIAGNOSIS — I4891 Unspecified atrial fibrillation: Secondary | ICD-10-CM | POA: Diagnosis not present

## 2020-06-11 DIAGNOSIS — M25561 Pain in right knee: Secondary | ICD-10-CM | POA: Diagnosis not present

## 2020-07-03 ENCOUNTER — Other Ambulatory Visit: Payer: Self-pay

## 2020-07-03 ENCOUNTER — Other Ambulatory Visit: Payer: Self-pay | Admitting: Podiatry

## 2020-07-03 ENCOUNTER — Ambulatory Visit (INDEPENDENT_AMBULATORY_CARE_PROVIDER_SITE_OTHER): Payer: 59

## 2020-07-03 ENCOUNTER — Ambulatory Visit (INDEPENDENT_AMBULATORY_CARE_PROVIDER_SITE_OTHER): Payer: 59 | Admitting: Podiatry

## 2020-07-03 ENCOUNTER — Encounter: Payer: Self-pay | Admitting: Podiatry

## 2020-07-03 DIAGNOSIS — M722 Plantar fascial fibromatosis: Secondary | ICD-10-CM | POA: Diagnosis not present

## 2020-07-03 MED ORDER — MELOXICAM 15 MG PO TABS: 15.0000 mg | ORAL_TABLET | Freq: Every day | ORAL | 3 refills | Status: DC

## 2020-07-03 MED ORDER — METHYLPREDNISOLONE 4 MG PO TBPK: ORAL_TABLET | ORAL | 0 refills | Status: DC

## 2020-07-03 NOTE — Progress Notes (Signed)
Subjective:  Patient ID: Jesus Kent, male    DOB: 09/14/1961,  MRN: [id number] HPI Chief Complaint  Patient presents with  . Foot Pain    Patient presents today for left heel pain x 2 weeks.  He says "it feels sharp, like a stone bruise and is painful when I first get up"  He has been icing and stretching with no relief    59 y.o. male presents with the above complaint.   ROS: Denies fever chills nausea vomiting muscle aches pains calf pain back pain chest pain shortness of breath.  Past Medical History:  Diagnosis Date  . Atrial fibrillation ([place])   . Hyperlipidemia    Past Surgical History:  Procedure Laterality Date  . COLONOSCOPY WITH PROPOFOL N/A 05/26/2016   Procedure: COLONOSCOPY WITH PROPOFOL;  Surgeon: [name], MD;  [facility] ENDOSCOPY;  Service: Endoscopy;  Laterality: N/A;  . JOINT REPLACEMENT    . knee surgery x3 Right   . TOTAL HIP ARTHROPLASTY      Current Outpatient Medications:  .  co-enzyme Q-10 30 MG capsule, Take 50 mg by mouth daily., Disp: , Rfl:  .  aspirin 81 MG EC tablet, Take by mouth., Disp: , Rfl:  .  atorvastatin (LIPITOR) 10 MG tablet, Take 10 mg by mouth daily., Disp: , Rfl:  .  Cholecalciferol 25 MCG (1000 UT) tablet, Take 1,000 Units by mouth daily., Disp: , Rfl:  .  diltiazem (TIAZAC) 120 MG 24 hr capsule, Take 120 mg by mouth daily. , Disp: , Rfl:  .  glucosamine-chondroitin 500-400 MG tablet, Take 1 tablet by mouth 3 (three) times daily., Disp: , Rfl:  .  magnesium oxide (MAG-OX) 400 MG tablet, Take 400 mg by mouth daily., Disp: , Rfl:  .  meloxicam (MOBIC) 15 MG tablet, Take 1 tablet (15 mg total) by mouth daily., Disp: 30 tablet, Rfl: 3 .  methylPREDNISolone (MEDROL DOSEPAK) 4 MG TBPK tablet, 6 day dose pack - take as directed, Disp: 21 tablet, Rfl: 0 .  MULTIPLE VITAMINS PO, Take 1 tablet by mouth daily., Disp: , Rfl:  .  Turmeric 500 MG TABS, Take by mouth as directed., Disp: , Rfl:  .  Zinc Sulfate (ZINC 15 PO), Take by  mouth., Disp: , Rfl:   No Known Allergies Review of Systems Objective:  There were no vitals filed for this visit.  General: Well developed, nourished, in no acute distress, alert and oriented x3   Dermatological: Skin is warm, dry and supple bilateral. Nails x 10 are well maintained; remaining integument appears unremarkable at this time. There are no open sores, no preulcerative lesions, no rash or signs of infection present.  Vascular: Dorsalis Pedis artery and Posterior Tibial artery pedal pulses are 2/4 bilateral with immedate capillary fill time. Pedal hair growth present. No varicosities and no lower extremity edema present bilateral.   Neruologic: Grossly intact via light touch bilateral. Vibratory intact via tuning fork bilateral. Protective threshold with Semmes Wienstein monofilament intact to all pedal sites bilateral. Patellar and Achilles deep tendon reflexes 2+ bilateral. No Babinski or clonus noted bilateral.   Musculoskeletal: No gross boney pedal deformities bilateral. No pain, crepitus, or limitation noted with foot and ankle range of motion bilateral. Muscular strength 5/5 in all groups tested bilateral.  Pain on palpation medial calcaneal tubercle left.  Gait: Unassisted, Nonantalgic.    Radiographs:  Radiographs taken today demonstrate soft tissue increase in density plantar fascial cannula insertion site with plantar distally oriented calcaneal  heel spur consistent with chronic long-term plantar fasciitis.  Assessment & Plan:   Assessment: Plantar fasciitis left.  Plan: At this point I went ahead and injected his left heel 20 mg Kenalog 5 mg Marcaine point of maximal tenderness.  Start him on a Medrol Dosepak to be followed by meloxicam.  Placed in a plantar fascial brace and a night splint.  Discussed appropriate shoe gear stretching exercise ice therapy sugar modifications.     Shalini Mair T. [place], [place]

## 2020-07-03 NOTE — Patient Instructions (Signed)

## 2020-07-16 DIAGNOSIS — M25561 Pain in right knee: Secondary | ICD-10-CM | POA: Diagnosis not present

## 2020-07-18 ENCOUNTER — Encounter: Payer: Self-pay | Admitting: Family

## 2020-07-18 ENCOUNTER — Other Ambulatory Visit: Payer: Self-pay

## 2020-07-18 ENCOUNTER — Ambulatory Visit (INDEPENDENT_AMBULATORY_CARE_PROVIDER_SITE_OTHER): Payer: 59 | Admitting: Family

## 2020-07-18 VITALS — BP 120/84 | HR 62 | Ht 72.0 in | Wt 233.0 lb

## 2020-07-18 DIAGNOSIS — I480 Paroxysmal atrial fibrillation: Secondary | ICD-10-CM

## 2020-07-18 DIAGNOSIS — R002 Palpitations: Secondary | ICD-10-CM

## 2020-07-18 DIAGNOSIS — E782 Mixed hyperlipidemia: Secondary | ICD-10-CM | POA: Diagnosis not present

## 2020-07-18 DIAGNOSIS — I493 Ventricular premature depolarization: Secondary | ICD-10-CM | POA: Diagnosis not present

## 2020-07-18 NOTE — Progress Notes (Signed)
Office Visit    Patient Name: Jesus Kent Date of Encounter: 07/19/2020  Primary Care Provider:  Patient, No Pcp Per Primary Cardiologist:  No primary care provider on file. Electrophysiologist:  None   Chief Complaint    ADRIC WREDE is a 59 y.o. male with a hx of PAF (2019), PVC, HLD, RBBB presents today for PVCs   Past Medical History    Past Medical History:  Diagnosis Date  . Atrial fibrillation ([place])   . Hyperlipidemia    Past Surgical History:  Procedure Laterality Date  . COLONOSCOPY WITH PROPOFOL N/A 05/26/2016   Procedure: COLONOSCOPY WITH PROPOFOL;  Surgeon: [name], MD;  Location: [facility] ENDOSCOPY;  Service: Endoscopy;  Laterality: N/A;  . JOINT REPLACEMENT    . knee surgery x3 Right   . TOTAL HIP ARTHROPLASTY      Allergies  No Known Allergies  History of Present Illness    GARREN Kent is a 59 y.o. male with a hx of PAF (2019), PVC, HLD, RBBB  last seen 12/11/19 by Dr. Rockey Situ.  He was diagnosed with atrial fibrillation in 2019. In the ED at that time he was started on medication for rate control and converted to NSR. Echo 01/2018 with LVEF 55%, normal RVSP, mild TR/MR, mild biatrial enlragement. He has not been maintained on anticoagulation as his CHADS2VASc is low and no recurrent atrial fib.   Presents today for PVCs. He noticed them on his Apple Watch. Of note, he has recently been on Meloxicam and Prednisone for plantar fasciitis and wonders if this could be contributory.   First noted PVCs, August 13th in [place] with his dad. Noted sensation of a "heart flutter". Noted pattern of bigeminy on his Apple Watch. Went to ED at the time with diagnosis of PVC.  Tells me Friday he checked his EKG and noted PVC. He had no palpitations at that time. Reports no shortness of breath nor dyspnea on exertion. Reports no chest pain, pressure, or tightness. No edema, orthopnea, PND.   Tells me he just wants reassurance that these PVCs are not dangerous  as he was told in the ED in [place].  EKGs/Labs/Other Studies Reviewed:   The following studies were reviewed today:  EKG:  EKG is  ordered today.  The ekg ordered today demonstrates NSR 62 bpm with no acute St/T wave changes.   Recent Labs: 03/28/2020: ALT 33; TSH 4.860 07/18/2020: BUN 15; Creatinine, Ser 0.90; Hemoglobin 15.1; Magnesium 2.1; Platelets 186; Potassium 4.0; Sodium 139  Recent Lipid Panel    Component Value Date/Time   CHOL 147 03/28/2020 0824   TRIG 83 03/28/2020 0824   HDL 40 03/28/2020 0824   CHOLHDL 3.7 03/28/2020 0824   LDLCALC 91 03/28/2020 0824    Home Medications   Current Meds  Medication Sig  . aspirin 81 MG EC tablet Take by mouth.  Marland Kitchen atorvastatin (LIPITOR) 10 MG tablet Take 10 mg by mouth daily.  . Cholecalciferol 25 MCG (1000 UT) tablet Take 1,000 Units by mouth daily.  Marland Kitchen co-enzyme Q-10 30 MG capsule Take 50 mg by mouth daily.  Marland Kitchen diltiazem (TIAZAC) 120 MG 24 hr capsule Take 120 mg by mouth daily.   Marland Kitchen glucosamine-chondroitin 500-400 MG tablet Take 1 tablet by mouth 3 (three) times daily.  . magnesium oxide (MAG-OX) 400 MG tablet Take 400 mg by mouth daily.  . MULTIPLE VITAMINS PO Take 1 tablet by mouth daily.  . Turmeric 500 MG TABS Take by mouth  as directed.  . Zinc Sulfate (ZINC 15 PO) Take by mouth.     Review of Systems  All other systems reviewed and are otherwise negative except as noted above.  Physical Exam    VS:  BP 120/84   Pulse 62   Ht 6' (1.829 m)   Wt 233 lb (105.7 kg)   BMI 31.60 kg/m  , BMI Body mass index is 31.6 kg/m.  Wt Readings from Last 3 Encounters:  07/18/20 233 lb (105.7 kg)  04/03/20 231 lb (104.8 kg)  12/11/19 223 lb 8 oz (101.4 kg)    GEN: Well nourished, well developed, in no acute distress. HEENT: normal. Neck: Supple, no JVD, carotid bruits, or masses. Cardiac: RRR, no murmurs, rubs, or gallops. No clubbing, cyanosis, edema.  Radials/DP/PT 2+ and equal bilaterally.  Respiratory:  Respirations  regular and unlabored, clear to auscultation bilaterally. GI: Soft, nontender, nondistended. MS: No deformity or atrophy. Skin: Warm and dry, no rash. Neuro:  Strength and sensation are intact. Psych: Normal affect.  Assessment & Plan    1. PAF - Isolated episode 2019. Continue diltiazem. CHADS2VASC of 0, defer anticoagulation as episode was isolated and low CHADS2VASc score.  2. HLD - Continue Atorvastatin.   3. PVC - Noted on Apple Watch. Asymptomatic with no palpitations. CBC, BMP today to rule out anemia or electrolytes abnormality as contributory. Normal thyroid function 03/2020. Reassurance provided that PVCs are benign. As he is asymptomatic and are only occurring occasionally by Apple Watch, defer ZIO at this time. Continue present dose of Diltiazem.   Disposition: Follow up March 2022 with Dr. Rockey Situ or APP.   Signed, Loel Dubonnet, NP 07/19/2020, 7:42 PM [facility]

## 2020-07-18 NOTE — Patient Instructions (Addendum)
Medication Instructions:  No medication changes today.  *If you need a refill on your cardiac medications before your next appointment, please call your pharmacy*  Lab Work: Your physician recommends that you return for lab work today: BMP, magnesium, CBC  If you have labs (blood work) drawn today and your tests are completely normal, you will receive your results only by: Marland Kitchen MyChart Message (if you have MyChart) OR . A paper copy in the mail If you have any lab test that is abnormal or we need to change your treatment, we will call you to review the results.  Testing/Procedures: Your EKG today shows normal sinus rhythm.  Your Apple watch readings show PVCs which are early beats in the bottom chambers of the heart. These are very common and not dangerous.   Follow-Up: At [facility], you and your health needs are our priority.  As part of our continuing mission to provide you with exceptional heart care, we have created designated Provider Care Teams.  These Care Teams include your primary Cardiologist (physician) and Advanced Practice Providers (APPs -  Physician Assistants and Nurse Practitioners) who all work together to provide you with the care you need, when you need it.  We recommend signing up for the patient portal called "MyChart".  Sign up information is provided on this After Visit Summary.  MyChart is used to connect with patients for Virtual Visits (Telemedicine).  Patients are able to view lab/test results, encounter notes, upcoming appointments, etc.  Non-urgent messages can be sent to your provider as well.   To learn more about what you can do with MyChart, go to [email or web address].    Your next appointment:   March 2022 with Dr. Rockey Situ or sooner if needed  Other Instructions  Premature Ventricular Contraction  A premature ventricular contraction (PVC) is a common kind of irregular heartbeat (arrhythmia). These contractions are extra heartbeats that start in  the ventricles of the heart and occur too early in the normal sequence. During the PVC, the heart's normal electrical pathway is not used, so the beat is shorter and less effective. In most cases, these contractions come and go and do not require treatment. What are the causes? Common causes of the condition include:  Smoking.  Drinking alcohol.  Certain medicines.  Some illegal drugs.  Stress.  Caffeine. Certain medical conditions can also cause PVCs:  Heart failure.  Heart attack, or coronary artery disease.  Heart valve problems.  Changes in minerals in the blood (electrolytes).  Low blood oxygen levels or high carbon dioxide levels. In many cases, the cause of this condition is not known. What are the signs or symptoms? The main symptom of this condition is fast or skipped heartbeats (palpitations). Other symptoms include:  Chest pain.  Shortness of breath.  Feeling tired.  Dizziness.  Difficulty exercising. In some cases, there are no symptoms. How is this diagnosed? This condition may be diagnosed based on:  Your medical history.  A physical exam. During the exam, the health care provider will check for irregular heartbeats.  Tests, such as: ? An ECG (electrocardiogram) to monitor the electrical activity of your heart. ? An ambulatory cardiac monitor. This device records your heartbeats for 24 hours or more. ? Stress tests to see how exercise affects your heart rhythm and blood supply. ? An echocardiogram. This test uses sound waves (ultrasound) to produce an image of your heart. ? An electrophysiology study (EPS). This test checks for electrical problems in your heart. How is  this treated? Treatment for this condition depends on any underlying conditions, the type of PVCs that you are having, and how much the symptoms are interfering with your daily life. Possible treatments include:  Avoiding things that cause premature contractions (triggers). These  include caffeine and alcohol.  Taking medicines if symptoms are severe or if the extra heartbeats are frequent.  Getting treatment for underlying conditions that cause PVCs.  In some cases, no treatment is required. Follow these instructions at home: Lifestyle  Do not use any products that contain nicotine or tobacco, such as cigarettes, e-cigarettes, and chewing tobacco. If you need help quitting, ask your health care provider.  Do not use illegal drugs.  Exercise regularly. Ask your health care provider what type of exercise is safe for you.  Try to get at least 7-9 hours of sleep each night, or as much as recommended by your health care provider.  Find healthy ways to manage stress. Avoid stressful situations when possible. Alcohol use  Do not drink alcohol if: ? Your health care provider tells you not to drink. ? You are pregnant, may be pregnant, or are planning to become pregnant. ? Alcohol triggers your episodes.  If you drink alcohol: ? Limit how much you use to:  0-1 drink a day for women.  0-2 drinks a day for men.  Be aware of how much alcohol is in your drink. In the [place] one drink equals one 12 oz bottle of beer (355 mL), one 5 oz glass of wine (148 mL), or one 1 oz glass of hard liquor (44 mL). General instructions  Take over-the-counter and prescription medicines only as told by your health care provider.  If caffeine triggers episodes of PVC, do not eat, drink, or use anything with caffeine in it.  Keep all follow-up visits as told by your health care provider. This is important. Contact a health care provider if you:  Feel palpitations. Get help right away if you:  Have chest pain.  Have shortness of breath.  Have sweating for no reason.  Have nausea and vomiting.  Become light-headed or you faint. Summary  A premature ventricular contraction (PVC) is a common kind of irregular heartbeat (arrhythmia).  In most cases, these contractions  come and go and do not require treatment.  You may need to wear an ambulatory cardiac monitor. This records your heartbeats for 24 hours or more.  Treatment depends on any underlying conditions, the type of PVCs that you are having, and how much the symptoms are interfering with your daily life. This information is not intended to replace advice given to you by your health care provider. Make sure you discuss any questions you have with your health care provider. Document Revised: 06/16/2018 Document Reviewed: 06/16/2018 Elsevier Patient Education  2020 [employer].

## 2020-07-19 ENCOUNTER — Encounter: Payer: Self-pay | Admitting: Family

## 2020-07-19 LAB — CBC WITH DIFFERENTIAL/PLATELET
Basophils Absolute: 0.1 10*3/uL (ref 0.0–0.2)
Basos: 1 %
EOS (ABSOLUTE): 0.1 10*3/uL (ref 0.0–0.4)
Eos: 2 %
Hematocrit: 43.3 % (ref 37.5–51.0)
Hemoglobin: 15.1 g/dL (ref 13.0–17.7)
Immature Grans (Abs): 0 10*3/uL (ref 0.0–0.1)
Immature Granulocytes: 0 %
Lymphocytes Absolute: 2.4 10*3/uL (ref 0.7–3.1)
Lymphs: 33 %
MCH: 30.6 pg (ref 26.6–33.0)
MCHC: 34.9 g/dL (ref 31.5–35.7)
MCV: 88 fL (ref 79–97)
Monocytes Absolute: 0.7 10*3/uL (ref 0.1–0.9)
Monocytes: 10 %
Neutrophils Absolute: 3.9 10*3/uL (ref 1.4–7.0)
Neutrophils: 54 %
Platelets: 186 10*3/uL (ref 150–450)
RBC: 4.93 x10E6/uL (ref 4.14–5.80)
RDW: 12.5 % (ref 11.6–15.4)
WBC: 7.2 10*3/uL (ref 3.4–10.8)

## 2020-07-19 LAB — BASIC METABOLIC PANEL
BUN/Creatinine Ratio: 17 (ref 9–20)
BUN: 15 mg/dL (ref 6–24)
CO2: 23 mmol/L (ref 20–29)
Calcium: 9.5 mg/dL (ref 8.7–10.2)
Chloride: 103 mmol/L (ref 96–106)
Creatinine, Ser: 0.9 mg/dL (ref 0.76–1.27)
GFR calc Af Amer: 108 mL/min/{1.73_m2} (ref >59)
GFR calc non Af Amer: 94 mL/min/{1.73_m2} (ref >59)
Glucose: 92 mg/dL (ref 65–99)
Potassium: 4 mmol/L (ref 3.5–5.2)
Sodium: 139 mmol/L (ref 134–144)

## 2020-07-19 LAB — MAGNESIUM: Magnesium: 2.1 mg/dL (ref 1.6–2.3)

## 2020-07-30 ENCOUNTER — Telehealth: Payer: Self-pay | Admitting: Cardiovascular Disease

## 2020-07-30 MED ORDER — DILTIAZEM HCL ER COATED BEADS 180 MG PO CP24: 180.0000 mg | ORAL_CAPSULE | Freq: Every day | ORAL | 1 refills | Status: DC

## 2020-07-30 NOTE — Telephone Encounter (Signed)
Spoke to patient.  He had a 30 second episode of heart fluttering on Friday. It was after mowing the yard. His Apple Watch showed it as PVCs with HR 43.  Since then, he's had them occasionally but only a few at a time. He does not feel them much during the day. Mainly, when he is at rest, sitting in the evening, he will feel the fluttering up in his throat. He's more aware of them at rest. He will look at his Apple Watch and it will show he's having the PVCs.  Denies chest pain, SOB, or dizziness. He has been limiting his caffeine. He thinks he may have been a little dehydrated after mowing last Friday.  He saw Laurann Montana, NP on 07/18/20 and similar symptoms were noted at that time. He is agreeable to wear monitor if needed. I reassured him that these were not life threatening and that it may just need some medication adjusting. He does not check his BP at home.  Routing to [place] for review.

## 2020-07-30 NOTE — Telephone Encounter (Signed)
Patient verbalized understanding of providers recommendations. He would like to remain on the Diltiazem and go ahead and increase that medication. He will let us know if any new questions or concerns arise. Rx sent to pharmacy.

## 2020-07-30 NOTE — Telephone Encounter (Signed)
PVCs noted during recent office visit. We discussed the benign nature of PVC's and that they are often triggered by stress, dehydration, caffeine. His lab work was reassuring that his electrolytes, blood counts, and thyroid were normal so no indication that those are causing his palpitations. I do not think we need a monitor at this time since we have confirmed PVCs by Apple Watch and EKG.  If he is aware of his palpitations and but they are not bothersome, no need for medication changes at this time.  If they are bothersome, we can increase Diltiazem to 180mg  once daily (he is presently on the 120mg  tablets).  If he just picked up the Diltiazem 120mg  and does not want to switch the dose, we could add Toprol 25mg  daily.  Thank you! Loel Dubonnet, NP

## 2020-07-30 NOTE — Telephone Encounter (Signed)
Patient c/o Palpitations:  High priority if patient c/o lightheadedness, shortness of breath, or chest pain  1) How long have you had palpitations/irregular HR/ Afib? Are you having the symptoms now? Since last week, and increasing last night. Not currently having palpitations.   2) Are you currently experiencing lightheadedness, SOB or CP? no  3) Do you have a history of afib (atrial fibrillation) or irregular heart rhythm? yes  4) Have you checked your BP or HR? (document readings if available): no BP check, HR has been 50-60 at rest, when the episode happened the other night HR was 43  5) Are you experiencing any other symptoms? no

## 2020-08-01 DIAGNOSIS — L570 Actinic keratosis: Secondary | ICD-10-CM | POA: Diagnosis not present

## 2020-08-01 DIAGNOSIS — L821 Other seborrheic keratosis: Secondary | ICD-10-CM | POA: Diagnosis not present

## 2020-08-01 DIAGNOSIS — D2261 Melanocytic nevi of right upper limb, including shoulder: Secondary | ICD-10-CM | POA: Diagnosis not present

## 2020-08-01 DIAGNOSIS — D2262 Melanocytic nevi of left upper limb, including shoulder: Secondary | ICD-10-CM | POA: Diagnosis not present

## 2020-08-01 DIAGNOSIS — D2272 Melanocytic nevi of left lower limb, including hip: Secondary | ICD-10-CM | POA: Diagnosis not present

## 2020-08-01 DIAGNOSIS — Z85828 Personal history of other malignant neoplasm of skin: Secondary | ICD-10-CM | POA: Diagnosis not present

## 2020-08-01 DIAGNOSIS — D485 Neoplasm of uncertain behavior of skin: Secondary | ICD-10-CM | POA: Diagnosis not present

## 2020-08-01 DIAGNOSIS — D225 Melanocytic nevi of trunk: Secondary | ICD-10-CM | POA: Diagnosis not present

## 2020-08-07 ENCOUNTER — Ambulatory Visit (INDEPENDENT_AMBULATORY_CARE_PROVIDER_SITE_OTHER): Payer: 59 | Admitting: Podiatry

## 2020-08-07 ENCOUNTER — Other Ambulatory Visit: Payer: Self-pay

## 2020-08-07 DIAGNOSIS — M722 Plantar fascial fibromatosis: Secondary | ICD-10-CM | POA: Diagnosis not present

## 2020-08-07 NOTE — Progress Notes (Signed)
He presents today for follow-up of his plantar fasciitis to his left heel.  He states that is about 70 to 80% improved.  He continues to wear his plantar fascial brace and continue his meloxicam.  He states that he have to be off the meloxicam for a little while because he was having a little heart event which turned out to be PVCs and he went back on his medicine after he was cleared by his cardiologist.  Objective: Vital signs are stable alert oriented x3.  Pulses are palpable.  Residual tenderness on palpation medial calcaneal tubercle of the left  heel.  Assessment: Plan fasciitis left heel.  70 to 80% resolved.  Plan: Reinjected the left heel today 20 mg Kenalog 5 mg Marcaine point of maximal tenderness.  Highly recommended that he continue his meloxicam plantar fascial brace follow-up with me if this does not resolve completely.

## 2020-08-13 DIAGNOSIS — J302 Other seasonal allergic rhinitis: Secondary | ICD-10-CM | POA: Insufficient documentation

## 2020-08-13 DIAGNOSIS — K219 Gastro-esophageal reflux disease without esophagitis: Secondary | ICD-10-CM | POA: Insufficient documentation

## 2020-08-13 DIAGNOSIS — K5732 Diverticulitis of large intestine without perforation or abscess without bleeding: Secondary | ICD-10-CM | POA: Insufficient documentation

## 2020-09-03 ENCOUNTER — Telehealth: Payer: Self-pay | Admitting: Cardiovascular Disease

## 2020-09-03 ENCOUNTER — Encounter: Payer: Self-pay | Admitting: Family

## 2020-09-03 NOTE — Telephone Encounter (Signed)
Reviewed EKG from [facility]. Shows normal sinus rhythm with occasional PVC (premature ventricular contractions). He has known PVCs - no need for ZIO monitor at this time.   Dehydration, stress, anxiety, caffeine can all contribute to increased PVC's. Recommend avoiding caffeine, staying well hydrated, deep breathing. His PVC's are not dangerous. His Diltiazem which he takes once daily helps to prevent them from happening as often. Please ensure he has taken his dose of Diltiazem.   Recommend continuing current dose of Diltiazem. If symptoms are really bothering him, we can send Rx for Propranolol 20mg  three times daily as needed for palpitations. If symptoms are not bothersome, recommend conservative measures listed above.   Jesus Dubonnet, NP

## 2020-09-03 NOTE — Telephone Encounter (Signed)
Responded back to the patient via MyChart with Caitlin's recommendations.   Will close this encounter.

## 2020-09-03 NOTE — Telephone Encounter (Signed)
Spoke with patient and he states that after morning gym exercises he had increase in palpitations. He did have some tea this morning but no other caffeined beverages. Discussed how energy drinks and caffeine can make this worse. Heart rate increased to 81 and he did apple watch recording which he will try to send through My Chart for provider review. He denies any chest pain, no shortness of breath, and is very active. He works out in gym, walks thru out during the day, and does not notice any problems during that time. Advised that I would send to provider for review and will be in touch with any recommendations. We did discuss palpitations and how these are usually benign but if they become frequent then other options are available. Inquired if he has worn a monitor for our office and he has not. He verbalized understanding of our conversation with no further questions at this time.

## 2020-09-03 NOTE — Telephone Encounter (Signed)
Patient c/o Palpitations:  High priority if patient c/o lightheadedness, shortness of breath, or chest pain  1) How long have you had palpitations/irregular HR/ Afib? Are you having the symptoms now? Started about an hour ago, currently having symptoms  2) Are you currently experiencing lightheadedness, SOB or CP? no  3) Do you have a history of afib (atrial fibrillation) or irregular heart rhythm? yes  4) Have you checked your BP or HR? (document readings if available): HR has been 80  5) Are you experiencing any other symptoms? No other symptoms  States he thinks he has some anxiety also. Please call to discuss.

## 2020-09-04 NOTE — Telephone Encounter (Signed)
Spoke with patient to confirm receipt of My Chart message and to see if he had any further questions. He verbalized understanding with no further questions. He did inquire about his wife's implant monitor being denied opened encounter for her to see if I can check with precert and Dr. Claudie Revering nurse. He verbalized understanding was appreciative for the call with no further questions at this time.

## 2020-09-30 ENCOUNTER — Encounter: Payer: Self-pay | Admitting: Family Medicine

## 2020-09-30 ENCOUNTER — Ambulatory Visit
Admission: EM | Admit: 2020-09-30 | Discharge: 2020-09-30 | Disposition: A | Discharge status: Home / Self Care | Payer: 59

## 2020-09-30 DIAGNOSIS — H02843 Edema of right eye, unspecified eyelid: Secondary | ICD-10-CM | POA: Diagnosis not present

## 2020-09-30 NOTE — Discharge Instructions (Addendum)
Nothing concerning at this time.  Recommend warm compresses to the area Follow up as needed for continued or worsening symptoms

## 2020-09-30 NOTE — ED Triage Notes (Signed)
Triaged by provider  

## 2020-09-30 NOTE — ED Provider Notes (Signed)
Jesus Kent    CSN: [id number] Arrival date & time: 09/30/20  2683      History   Chief Complaint No chief complaint on file.   HPI Jesus Kent is a 59 y.o. male.   Patient is a 59 year old male who presents today with right lower lid swelling.  This started upon waking up this morning.  Denies any trouble with vision, pain around the eye, redness.  No foreign bodies in the eye.  No injuries to the eye.  No fevers.     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 Authorizing Provider  aspirin 81 MG EC tablet Take by mouth. 05/11/17   [provider]  atorvastatin (LIPITOR) 10 MG tablet Take 10 mg by mouth daily. 10/09/19   [provider]  Cholecalciferol 25 MCG (1000 UT) tablet Take 1,000 Units by mouth daily.    [provider]  co-enzyme Q-10 30  MG capsule Take 50 mg by mouth daily.    [provider]  diltiazem (CARDIZEM CD) 180 MG 24 hr capsule Take 1 capsule (180 mg total) by mouth daily. 07/30/20   Alver Sorrow, NP  glucosamine-chondroitin 500-400 MG tablet Take 1 tablet by mouth 3 (three) times daily.    [provider]  magnesium oxide (MAG-OX) 400 MG tablet Take 400 mg by mouth daily.    [provider]  MULTIPLE VITAMINS PO Take 1 tablet by mouth daily.    [provider]  Turmeric 500 MG TABS Take by mouth as directed.    [provider]  Zinc Sulfate (ZINC 15 PO) Take by mouth.    [provider]    Family History Family History  Problem Relation Age of Onset   Hyperlipidemia Mother    Heart disease Father        stent     Social History Social History   Tobacco Use   Smoking status: Never Smoker   Smokeless tobacco: Former Forensic psychologist Use: Never used  Substance Use Topics   Alcohol use: Never    Comment: rare   Drug use: No     Allergies   Patient has no known allergies.   Review of Systems Review of Systems   Physical Exam Triage Vital Signs ED Triage Vitals [09/30/20 1230]  Enc Vitals  Group     BP 119/78     Pulse Rate 79     Resp 18     Temp 98.7 F (37.1 C)     Temp Source Oral     SpO2 95 %     Weight      Height      Head Circumference      Peak Flow      Pain Score      Pain Loc      Pain Edu?      Excl. in [place]?    No data found.  Updated Vital Signs BP 119/78 (BP Location: Left Arm)    Pulse 79    Temp 98.7 F (37.1 C) (Oral)    Resp 18    SpO2 95%   Visual Acuity Right Eye Distance:   Left Eye Distance:   Bilateral Distance:    Right Eye Near:   Left Eye Near:    Bilateral Near:     Physical Exam Vitals and nursing note reviewed.  Constitutional:      Appearance: Normal appearance.  HENT:     Head: Normocephalic and atraumatic.     Nose: Nose normal.  Eyes:     General:         Right eye: No discharge.        Left eye: No discharge.     Conjunctiva/sclera: Conjunctivae normal.     Comments: Mild right lower lid swelling without any erythema.  No periorbital pain or orbital pain with EOM.  Pulmonary:     Effort: Pulmonary effort is normal.  Musculoskeletal:        General: Normal range of motion.     Cervical back: Normal range of motion.  Skin:    General: Skin is warm and dry.  Neurological:     Mental Status: He is alert.  Psychiatric:        Mood and Affect: Mood normal.      UC Treatments / Results  Labs (all labs ordered are listed, but only abnormal results are displayed) Labs Reviewed - No data to display  EKG   Radiology No results found.  Procedures Procedures (including critical care time)  Medications Ordered in UC Medications - No data to display  Initial Impression / Assessment and Plan / UC Course  I have reviewed the triage vital signs and the nursing notes.  Pertinent labs & imaging results that were available during my care of the patient were reviewed by me and considered in my medical decision making (see chart for details).     Swelling of right eyelid.  Most likely some blepharitis.  We will have him do warm compresses Follow up as needed for continued or worsening symptoms  Final Clinical Impressions(s) / UC Diagnoses   Final diagnoses:  Swelling of right eyelid     Discharge Instructions     Nothing concerning at this time.  Recommend warm compresses to the area Follow up as needed for continued or worsening symptoms     ED Prescriptions    None     PDMP not reviewed this encounter.   Orvan July, NP 09/30/20 1256

## 2020-10-01 DIAGNOSIS — M25569 Pain in unspecified knee: Secondary | ICD-10-CM | POA: Diagnosis not present

## 2020-10-01 DIAGNOSIS — M25561 Pain in right knee: Secondary | ICD-10-CM | POA: Diagnosis not present

## 2020-10-15 ENCOUNTER — Other Ambulatory Visit: Payer: Self-pay

## 2020-10-15 ENCOUNTER — Ambulatory Visit: Payer: Self-pay | Admitting: Physician Assistant

## 2020-10-15 ENCOUNTER — Encounter: Payer: Self-pay | Admitting: Physician Assistant

## 2020-10-15 VITALS — BP 126/80 | HR 96 | Temp 99.0°F | Resp 12 | Ht 72.0 in | Wt 229.0 lb

## 2020-10-15 DIAGNOSIS — Z0000 Encounter for general adult medical examination without abnormal findings: Secondary | ICD-10-CM

## 2020-10-15 DIAGNOSIS — R1032 Left lower quadrant pain: Secondary | ICD-10-CM

## 2020-10-15 MED ORDER — METRONIDAZOLE 500 MG PO TABS: 500.0000 mg | ORAL_TABLET | Freq: Two times a day (BID) | ORAL | 0 refills | Status: DC

## 2020-10-15 MED ORDER — MAGNESIUM OXIDE 400 (241.3 MG) MG PO TABS: 400.0000 mg | ORAL_TABLET | Freq: Once | ORAL | Status: DC

## 2020-10-15 MED ORDER — CIPROFLOXACIN HCL 500 MG PO TABS: 500.0000 mg | ORAL_TABLET | Freq: Two times a day (BID) | ORAL | 0 refills | Status: DC

## 2020-10-15 NOTE — Progress Notes (Signed)
Woke up with LLQ pain Hx of Diverticulits Last flare was about 2 years ago  AMD

## 2020-10-15 NOTE — Progress Notes (Signed)
   Subjective:    Patient ID: Jesus Kent, male    DOB: 1960-11-29, 60 y.o.   MRN: [id number]  HPI  Patient presents with complaint of left lower quadrant abdominal pain.  Patient states this is similar to his past history of diverticulitis.  Patient denies vomiting or diarrhea.  Patient described pain as "cramping".  No palliative measures for complaint.  Review of Systems   PAF and plantar fasciitis Objective:   Physical Exam No acute distress.  BP is 126/80.  Pulse 96.  Temperature 99. Abdomen is mildly distended, soft, with normoactive bowel sounds, mild guarding palpation left lower quadrant.  .         Assessment & Plan: Left low quadrant abdominal pain. Patient given a prescription for Flagyl and Cipro.  Patient advised to follow-up if no improvement or worsening complaint within the next 2 to 3 days.

## 2020-10-23 ENCOUNTER — Other Ambulatory Visit: Payer: Self-pay | Admitting: Podiatry

## 2020-10-27 NOTE — Progress Notes (Signed)
Cardiology Office Note  Date:  10/28/2020   ID:  Jesus Kent, DOB Nov 01, 1960, MRN [id number]  PCP:  Patient, No Pcp Per   Chief Complaint  Patient presents with  . Other    Follow up - Meds reviewed verbally with patient.     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  Still having issues with ectopy, comes a goes On smart phone rhythm strips looks somewhat like PACs, unable to exclude some PVCs  Previously seen in clinic, diltiazem up to 180 from 120 mg Reports a higher dose did not really help his ectopy  Backed down on less sweet tea, caffeinated diet May have helped somewhat  Denies any tachycardia palpitations concerning for atrial fibrillation Weight stable  EKG personally reviewed by myself on todays visit Shows normal sinus rhythm rate 63 bpm no significant ST-T wave changes  Other past medical history reviewed Lone atrial fib 2019 Went to ER, started on medication for rate control, converted to normal sinus rhythm At echocardiogram stress test, both essentially normal  Echo 01/2018 NORMAL LEFT VENTRICULAR SYSTOLIC FUNCTION WITH AN ESTIMATED EF = 55 % NORMAL RIGHT VENTRICULAR SYSTOLIC FUNCTION MILD TRICUSPID AND MITRAL VALVE INSUFFICIENCY NO VALVULAR STENOSIS MILD BIATRIAL ENLARGEMENT  No other episodes of atrial fibrillation apart from 2019  CT scan chest, CT scan abdomen pelvis, no significant aortic atherosclerosis no coronary calcification   PMH:   has a past medical history of Atrial fibrillation ([place]), Herpes simplex type 2 infection, and Hyperlipidemia.  PSH:    Past Surgical History:  Procedure Laterality Date   . COLONOSCOPY WITH PROPOFOL N/A 05/26/2016   Procedure: COLONOSCOPY WITH PROPOFOL;  Surgeon: [name], MD;  Location: [facility] ENDOSCOPY;  Service: Endoscopy;  Laterality: N/A;  . JOINT REPLACEMENT    . knee surgery x3 Right   . TOTAL HIP ARTHROPLASTY      Current Outpatient Medications  Medication Sig Dispense Refill  . aspirin 81 MG EC tablet Take by mouth.    . Cholecalciferol 25 MCG (1000 UT) tablet Take 1,000 Units by mouth daily.    Marland Kitchen co-enzyme Q-10 30 MG capsule Take 50 mg by mouth daily.    Marland Kitchen diltiazem (CARDIZEM CD) 180 MG 24 hr capsule Take 1 capsule (180 mg total) by mouth daily. 90 capsule 1  . glucosamine-chondroitin 500-400 MG tablet Take 1 tablet by mouth 3 (three) times daily.    . magnesium oxide (MAG-OX) 400 MG tablet Take 400 mg by mouth daily.    . meloxicam (MOBIC) 15 MG tablet TAKE 1 TABLET BY MOUTH EVERY DAY 30 tablet 3  . metoprolol tartrate (LOPRESSOR) 25 MG tablet Take 1 tablet (25 mg total) by mouth 2 (two) times daily as needed. 60 tablet 1  . MULTIPLE VITAMINS PO Take 1 tablet by mouth daily.    . Turmeric 500 MG TABS Take by mouth as directed.    . Zinc Sulfate (ZINC 15 PO) Take by mouth.    Marland Kitchen atorvastatin (LIPITOR) 10 MG tablet Take 1 tablet (10 mg total) by mouth daily. 90 tablet 3   Current Facility-Administered Medications  Medication Dose Route Frequency Provider Last Rate Last Admin  . magnesium oxide (MAG-OX)  tablet 400 mg  400 mg Oral Once Sable Feil, PA-C         Allergies:   Patient has no known allergies.   Social History:  The patient  reports that he has never smoked. He has quit using smokeless tobacco. He reports that he does not drink alcohol and does not use drugs.   Family History:   family history includes Heart disease in his father; Hyperlipidemia in his mother.    Review of Systems: Review of Systems  Constitutional: Negative.   HENT: Negative.   Respiratory: Negative.   Cardiovascular: Negative.    Gastrointestinal: Negative.   Musculoskeletal: Negative.   Neurological: Negative.   Psychiatric/Behavioral: Negative.   All other systems reviewed and are negative.   PHYSICAL EXAM: VS:  BP 110/76 (BP Location: Left Arm, Patient Position: Sitting, Cuff Size: Normal)   Pulse 63   Ht 6' (1.829 m)   Wt 232 lb (105.2 kg)   SpO2 97%   BMI 31.46 kg/m  , BMI Body mass index is 31.46 kg/m. Constitutional:  oriented to person, place, and time. No distress.  HENT:  Head: Grossly normal Eyes:  no discharge. No scleral icterus.  Neck: No JVD, no carotid bruits  Cardiovascular: Regular rate and rhythm, no murmurs appreciated Pulmonary/Chest: Clear to auscultation bilaterally, no wheezes or rails Abdominal: Soft.  no distension.  no tenderness.  Musculoskeletal: Normal range of motion Neurological:  normal muscle tone. Coordination normal. No atrophy Skin: Skin warm and dry Psychiatric: normal affect, pleasant  Recent Labs: 03/28/2020: ALT 33; TSH 4.860 07/18/2020: BUN 15; Creatinine, Ser 0.90; Hemoglobin 15.1; Magnesium 2.1; Platelets 186; Potassium 4.0; Sodium 139    Lipid Panel Lab Results  Component Value Date   CHOL 147 03/28/2020   HDL 40 03/28/2020   LDLCALC 91 03/28/2020   TRIG 83 03/28/2020      Wt Readings from Last 3 Encounters:  10/28/20 232 lb (105.2 kg)  10/15/20 229 lb (103.9 kg)  07/18/20 233 lb (105.7 kg)     ASSESSMENT AND PLAN:  Problem List Items Addressed This Visit      Cardiology Problems   PAF (paroxysmal atrial fibrillation) (HCC) - Primary   Relevant Medications   metoprolol tartrate (LOPRESSOR) 25 MG tablet   atorvastatin (LIPITOR) 10 MG tablet   Other Relevant Orders   EKG 12-Lead   Hyperlipidemia   Relevant Medications   metoprolol tartrate (LOPRESSOR) 25 MG tablet   atorvastatin (LIPITOR) 10 MG tablet   Other Relevant Orders   EKG 12-Lead     Paroxysmal atrial fibrillation [place] fib 2019, low CHADS VASC, not on  anticoagulation On diltiazem  APCs/ectopy Unable to exclude other arrhythmia such as PVCs Suggested he take metoprolol tartrate 12.5 mg up to 25 mg as needed for breakthrough ectopy If no improvement may need to decrease the diltiazem dosing and take metoprolol succinate on a regular basis  Hyperlipidemia Cholesterol is at goal on the current lipid regimen. No changes to the medications were made.    Total encounter time more than 25 minutes  Greater than 50% was spent in counseling and coordination of care with the patient    Signed, Esmond Plants, M.D., Ph.D. [place], [place]

## 2020-10-28 ENCOUNTER — Ambulatory Visit (INDEPENDENT_AMBULATORY_CARE_PROVIDER_SITE_OTHER): Payer: 59 | Admitting: Cardiovascular Disease

## 2020-10-28 ENCOUNTER — Other Ambulatory Visit: Payer: Self-pay

## 2020-10-28 ENCOUNTER — Encounter: Payer: Self-pay | Admitting: Cardiovascular Disease

## 2020-10-28 VITALS — BP 110/76 | HR 63 | Ht 72.0 in | Wt 232.0 lb

## 2020-10-28 DIAGNOSIS — E782 Mixed hyperlipidemia: Secondary | ICD-10-CM

## 2020-10-28 DIAGNOSIS — I480 Paroxysmal atrial fibrillation: Secondary | ICD-10-CM | POA: Diagnosis not present

## 2020-10-28 MED ORDER — ATORVASTATIN CALCIUM 10 MG PO TABS: 10.0000 mg | ORAL_TABLET | Freq: Every day | ORAL | 3 refills | Status: DC

## 2020-10-28 MED ORDER — METOPROLOL TARTRATE 25 MG PO TABS: 25.0000 mg | ORAL_TABLET | Freq: Two times a day (BID) | ORAL | 1 refills | Status: DC | PRN

## 2020-10-28 NOTE — Patient Instructions (Signed)
Medication Instructions:  Metoprolol tartrate 25 mg twice as day as needed   If you need a refill on your cardiac medications before your next appointment, please call your pharmacy.    Lab work: No new labs needed   If you have labs (blood work) drawn today and your tests are completely normal, you will receive your results only by: Marland Kitchen MyChart Message (if you have MyChart) OR . A paper copy in the mail If you have any lab test that is abnormal or we need to change your treatment, we will call you to review the results.   Testing/Procedures: No new testing needed   Follow-Up: At [facility], you and your health needs are our priority.  As part of our continuing mission to provide you with exceptional heart care, we have created designated Provider Care Teams.  These Care Teams include your primary Cardiologist (physician) and Advanced Practice Providers (APPs -  Physician Assistants and Nurse Practitioners) who all work together to provide you with the care you need, when you need it.  . You will need a follow up appointment in 12 months  . Providers on your designated Care Team:   . Murray Hodgkins, NP . Christell Faith, PA-C . Marrianne Mood, PA-C  Any Other Special Instructions Will Be Listed Below (If Applicable).  COVID-19 Vaccine Information can be found at: ShippingScam.co.uk For questions related to vaccine distribution or appointments, please email vaccine@[email or web address] .com or call 8014383954.

## 2020-11-19 ENCOUNTER — Other Ambulatory Visit: Payer: Self-pay | Admitting: Cardiovascular Disease

## 2020-11-19 NOTE — Telephone Encounter (Signed)
Rx request sent to pharmacy.  

## 2020-12-05 ENCOUNTER — Other Ambulatory Visit: Payer: Self-pay | Admitting: Cardiovascular Disease

## 2020-12-05 NOTE — Telephone Encounter (Signed)
Rx request sent to pharmacy.  

## 2020-12-10 ENCOUNTER — Ambulatory Visit: Payer: 59 | Admitting: Cardiovascular Disease

## 2021-01-22 ENCOUNTER — Other Ambulatory Visit: Payer: Self-pay | Admitting: Family

## 2021-02-12 ENCOUNTER — Other Ambulatory Visit: Payer: Self-pay

## 2021-02-12 ENCOUNTER — Encounter: Payer: Self-pay | Admitting: Podiatry

## 2021-02-12 ENCOUNTER — Ambulatory Visit (INDEPENDENT_AMBULATORY_CARE_PROVIDER_SITE_OTHER): Payer: 59 | Admitting: Podiatry

## 2021-02-12 DIAGNOSIS — M722 Plantar fascial fibromatosis: Secondary | ICD-10-CM

## 2021-02-12 DIAGNOSIS — L600 Ingrowing nail: Secondary | ICD-10-CM | POA: Diagnosis not present

## 2021-02-12 MED ORDER — TRIAMCINOLONE ACETONIDE 40 MG/ML IJ SUSP
20.0000 mg | Freq: Once | INTRAMUSCULAR | Status: AC
  Administered 2021-02-12: 20 mg

## 2021-02-12 MED ORDER — NEOMYCIN-POLYMYXIN-HC 1 % OT SOLN: OTIC | 1 refills | Status: DC

## 2021-02-12 NOTE — Progress Notes (Signed)
He presents today for follow-up plan fasciitis of the left foot states that he was doing great he started to bother me just a little bit again currently he is also complaining of painful ingrown toenail to the fibular border of the hallux right.  Objective: Vital signs are stable he is alert oriented x3 there is no erythema edema cellulitis drainage odor with exception of some mild tenderness along the fibular border of the hallux right.  He does have pain on palpation medial calcaneal r tubercle of the left heel.  Assessment: Pain in limb secondary to ingrown nail Planter fasciitis left.  Plan: Discussed etiology pathology conservative versus surgical therapies.  At this point injected the left heel with 20 mg Kenalog 5 mg Marcaine.

## 2021-02-12 NOTE — Patient Instructions (Signed)

## 2021-02-17 ENCOUNTER — Telehealth: Payer: Self-pay | Admitting: Podiatry

## 2021-02-17 NOTE — Telephone Encounter (Signed)
Okay to put in NP spot at [facility] on Wed, 5/18

## 2021-02-17 NOTE — Telephone Encounter (Signed)
Patient called in expressing concerns regarding the healing of toe from previous procedure. Patient stated he was told to call if he was experiencing any issues regarding healing. Stated he is headed out of town and would like to be seen sooner. I attempted to schedule patient with [facility] but due to providers schedule, the patient wouldn't be able to seen by [facility] until May 23rd. Patient then stated that was unacceptable especially with him about to be  Traveling and also the fact he was told if he needed to be seen to contact office. "How can I be told to call in for appointment then I call and can't be seen, that's crazy. Guess I deal with the infection while I'm gone" then patient disconnected line. Patient never mentioned infection until before he disconnected line, patient stated he just wanted to have [facility] check it before leaving for trip. Please Advise

## 2021-02-17 NOTE — Telephone Encounter (Signed)
Thank you, patient is scheduled and aware

## 2021-02-19 ENCOUNTER — Ambulatory Visit (INDEPENDENT_AMBULATORY_CARE_PROVIDER_SITE_OTHER): Payer: 59 | Admitting: Podiatry

## 2021-02-19 ENCOUNTER — Encounter: Payer: Self-pay | Admitting: Podiatry

## 2021-02-19 ENCOUNTER — Other Ambulatory Visit: Payer: Self-pay

## 2021-02-19 DIAGNOSIS — L03031 Cellulitis of right toe: Secondary | ICD-10-CM | POA: Diagnosis not present

## 2021-02-19 MED ORDER — DOXYCYCLINE HYCLATE 100 MG PO TABS: 100.0000 mg | ORAL_TABLET | Freq: Two times a day (BID) | ORAL | 0 refills | Status: DC

## 2021-02-19 NOTE — Progress Notes (Signed)
He presents today for follow-up of his matrixectomy hallux right.  States that he is about to leave for [place] and wants to make sure that is looking good and not infected.  States that it was a lot more red but has started to calm down a little bit continues to soak.  Objective: Vital signs are stable he is alert oriented x3.  Right hallux does demonstrate some mild erythema no purulence no malodor just some maceration.  The fibular border does demonstrate tenderness on palpation.  Assessment: Mild paronychia hallux right status post matrixectomy.  Plan: At this point I went ahead and started him on doxycycline instructed him to soak in Epson salts and warm water cover during the day leave open at bedtime.  Also instructed him to soak while he is on vacation as well as cover with a waterproof Band-Aid anytime he is in the sand or even in the pool.

## 2021-02-20 NOTE — Progress Notes (Signed)
Scheduled to complete physical 03/10/21 with Orson Gear, NP-C.  AMD

## 2021-02-21 ENCOUNTER — Other Ambulatory Visit: Payer: Self-pay

## 2021-02-21 ENCOUNTER — Ambulatory Visit: Payer: Self-pay

## 2021-02-21 DIAGNOSIS — H5203 Hypermetropia, bilateral: Secondary | ICD-10-CM | POA: Diagnosis not present

## 2021-02-21 DIAGNOSIS — Z0000 Encounter for general adult medical examination without abnormal findings: Secondary | ICD-10-CM

## 2021-02-21 LAB — POCT URINALYSIS DIPSTICK
Bilirubin, UA: NEGATIVE
Blood, UA: NEGATIVE
Glucose, UA: NEGATIVE
Ketones, UA: NEGATIVE
Leukocytes, UA: NEGATIVE
Nitrite, UA: NEGATIVE
Protein, UA: NEGATIVE
Spec Grav, UA: 1.015 (ref 1.010–1.025)
Urobilinogen, UA: 0.2 E.U./dL
pH, UA: 7 (ref 5.0–8.0)

## 2021-02-22 LAB — CMP12+LP+TP+TSH+6AC+PSA+CBC…
ALT: 19 IU/L (ref 0–44)
AST: 20 IU/L (ref 0–40)
Albumin/Globulin Ratio: 1.6 (ref 1.2–2.2)
Albumin: 4.6 g/dL (ref 3.8–4.9)
Alkaline Phosphatase: 73 IU/L (ref 44–121)
BUN/Creatinine Ratio: 16 (ref 9–20)
BUN: 15 mg/dL (ref 6–24)
Basophils Absolute: 0.1 10*3/uL (ref 0.0–0.2)
Basos: 1 %
Bilirubin Total: 0.7 mg/dL (ref 0.0–1.2)
Calcium: 9.6 mg/dL (ref 8.7–10.2)
Chloride: 101 mmol/L (ref 96–106)
Chol/HDL Ratio: 2.7 ratio (ref 0.0–5.0)
Cholesterol, Total: 142 mg/dL (ref 100–199)
Creatinine, Ser: 0.91 mg/dL (ref 0.76–1.27)
EOS (ABSOLUTE): 0.1 10*3/uL (ref 0.0–0.4)
Eos: 1 %
Estimated CHD Risk: <0.5 times avg. (ref 0.0–1.0)
Free Thyroxine Index: 1.8 (ref 1.2–4.9)
GGT: 14 IU/L (ref 0–65)
Globulin, Total: 2.8 g/dL (ref 1.5–4.5)
Glucose: 84 mg/dL (ref 65–99)
HDL: 52 mg/dL (ref >39)
Hematocrit: 47.9 % (ref 37.5–51.0)
Hemoglobin: 16 g/dL (ref 13.0–17.7)
Immature Grans (Abs): 0 10*3/uL (ref 0.0–0.1)
Immature Granulocytes: 0 %
Iron: 100 ug/dL (ref 38–169)
LDH: 223 IU/L (ref 121–224)
LDL Chol Calc (NIH): 76 mg/dL (ref 0–99)
Lymphocytes Absolute: 2.1 10*3/uL (ref 0.7–3.1)
Lymphs: 30 %
MCH: 29.7 pg (ref 26.6–33.0)
MCHC: 33.4 g/dL (ref 31.5–35.7)
MCV: 89 fL (ref 79–97)
Monocytes Absolute: 0.7 10*3/uL (ref 0.1–0.9)
Monocytes: 10 %
Neutrophils Absolute: 3.9 10*3/uL (ref 1.4–7.0)
Neutrophils: 58 %
Phosphorus: 3.3 mg/dL (ref 2.8–4.1)
Platelets: 219 10*3/uL (ref 150–450)
Potassium: 4.4 mmol/L (ref 3.5–5.2)
Prostate Specific Ag, Serum: 0.5 ng/mL (ref 0.0–4.0)
RBC: 5.39 x10E6/uL (ref 4.14–5.80)
RDW: 13.1 % (ref 11.6–15.4)
Sodium: 138 mmol/L (ref 134–144)
T3 Uptake Ratio: 23 % — ABNORMAL LOW (ref 24–39)
T4, Total: 7.9 ug/dL (ref 4.5–12.0)
TSH: 3.59 u[IU]/mL (ref 0.450–4.500)
Total Protein: 7.4 g/dL (ref 6.0–8.5)
Triglycerides: 68 mg/dL (ref 0–149)
Uric Acid: 4.6 mg/dL (ref 3.8–8.4)
VLDL Cholesterol Cal: 14 mg/dL (ref 5–40)
WBC: 6.9 10*3/uL (ref 3.4–10.8)
eGFR: 97 mL/min/{1.73_m2} (ref >59)

## 2021-03-05 ENCOUNTER — Other Ambulatory Visit: Payer: Self-pay

## 2021-03-05 ENCOUNTER — Ambulatory Visit (INDEPENDENT_AMBULATORY_CARE_PROVIDER_SITE_OTHER): Payer: 59

## 2021-03-05 DIAGNOSIS — M722 Plantar fascial fibromatosis: Secondary | ICD-10-CM

## 2021-03-05 NOTE — Progress Notes (Signed)
Patient presents today to be casted for custom molded orthotics. Dr. Milinda Pointer has been treating patient for Plantar Fasciitis.   Impression foam cast was taken. ABN signed.  Patient info-  Shoe size: 12 medium  Shoe style: work boot  Height: 6 ft  Weight: 220 lbs   Patient will be notified once orthotics arrive in office and reappoint for fitting at that time.

## 2021-03-10 ENCOUNTER — Other Ambulatory Visit: Payer: Self-pay

## 2021-03-10 ENCOUNTER — Ambulatory Visit: Payer: Self-pay | Admitting: Adult Health

## 2021-03-10 ENCOUNTER — Encounter: Payer: Self-pay | Admitting: Adult Health

## 2021-03-10 VITALS — BP 116/81 | HR 75 | Temp 97.6°F | Resp 14 | Ht 72.0 in | Wt 220.0 lb

## 2021-03-10 DIAGNOSIS — Z6829 Body mass index (BMI) 29.0-29.9, adult: Secondary | ICD-10-CM

## 2021-03-10 DIAGNOSIS — K219 Gastro-esophageal reflux disease without esophagitis: Secondary | ICD-10-CM

## 2021-03-10 DIAGNOSIS — E782 Mixed hyperlipidemia: Secondary | ICD-10-CM

## 2021-03-10 DIAGNOSIS — I480 Paroxysmal atrial fibrillation: Secondary | ICD-10-CM

## 2021-03-10 DIAGNOSIS — K5732 Diverticulitis of large intestine without perforation or abscess without bleeding: Secondary | ICD-10-CM

## 2021-03-10 DIAGNOSIS — Z0000 Encounter for general adult medical examination without abnormal findings: Secondary | ICD-10-CM

## 2021-03-10 NOTE — Progress Notes (Signed)
[facility] [address] [place], [place] [address]  Internal MEDICINE  Office Visit Note  Patient Name: Jesus Kent  [id number]  [id number]  Date of Service: 03/10/2021  Chief Complaint  Patient presents with  . Annual Exam    Pt presents today to complete physical with Mirna Mires Pt denies any issues or concerns at this time. CL,RMA     HPI Pt is here for routine health maintenance examination.  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  He has been married for 25 years, and has three children ages 37 to 68.  His history is remarkable for one episode of PAF, he takes Diltiazem and sees Dr. Rockey Situ yearly for follow up.  He also has some GERD, and HLD, for which he takes a Statin.  He also has diverticulitis and has colonoscopy every 5 years.  He is due again in 2 months. He continues to exercise regularly he describes lifting weights with 20-25 minutes of cardio 3 times a week.  He is watching his diet and working on more weight loss.  He has lost 25 pounds.    Current Medication: Outpatient Encounter Medications as of 03/10/2021  Medication Sig  . aspirin 81 MG EC tablet Take by mouth.  Marland Kitchen atorvastatin (LIPITOR) 10 MG tablet Take 1 tablet (10 mg total) by mouth daily.  . Cholecalciferol 25 MCG (1000 UT) tablet Take 1,000 Units by mouth daily.  Marland Kitchen co-enzyme Q-10 30 MG capsule Take 50 mg by mouth daily.  Marland Kitchen diltiazem (CARDIZEM CD) 180 MG 24 hr capsule TAKE 1 CAPSULE BY MOUTH EVERY DAY  . magnesium oxide (MAG-OX) 400 MG tablet Take 400 mg by mouth daily.  . meloxicam (MOBIC) 15 MG tablet TAKE 1 TABLET BY MOUTH EVERY DAY (Patient taking differently: As needed)  . metoprolol tartrate (LOPRESSOR) 25 MG tablet TAKE 1 TABLET BY MOUTH TWICE A DAY AS NEEDED  . MULTIPLE VITAMINS PO Take 1 tablet by mouth daily.  . NEOMYCIN-POLYMYXIN-HYDROCORTISONE (CORTISPORIN) 1 % SOLN OTIC solution Apply 1-2 drops to toe BID after soaking  . Turmeric 500 MG TABS Take by mouth as directed.  . Zinc  Sulfate (ZINC 15 PO) Take by mouth.  . [DISCONTINUED] doxycycline (VIBRA-TABS) 100 MG tablet Take 1 tablet (100 mg total) by mouth 2 (two) times daily.  . [DISCONTINUED] glucosamine-chondroitin 500-400 MG tablet Take 1 tablet by mouth 3 (three) times daily.  . [DISCONTINUED] magnesium oxide (MAG-OX) tablet 400 mg    No facility-administered encounter medications on file as of 03/10/2021.    Surgical History: Past Surgical History:  Procedure Laterality Date  . COLONOSCOPY WITH PROPOFOL N/A 05/26/2016   Procedure: COLONOSCOPY WITH PROPOFOL;  Surgeon: [name], MD;  Location: [facility] ENDOSCOPY;  Service: Endoscopy;  Laterality: N/A;  . JOINT REPLACEMENT    . knee surgery x3 Right   . TOTAL HIP ARTHROPLASTY      Medical History: Past Medical History:  Diagnosis Date  . Atrial fibrillation ([place])   . Herpes simplex type 2 infection    [facility]  . Hyperlipidemia     Family History: Family History  Problem Relation Age of Onset  . Hyperlipidemia Mother   . Heart disease Father        stent     Social History: Social History   Socioeconomic History  . Marital status: Married    Spouse name: Not on file  . Number of children: Not on file  . Years  of education: Not on file  . Highest education level: Not on file  Occupational History  . Not on file  Tobacco Use  . Smoking status: Never Smoker  . Smokeless tobacco: Former Network engineer  . Vaping Use: Never used  Substance and Sexual Activity  . Alcohol use: Never    Comment: rare  . Drug use: No  . Sexual activity: Not on file  Other Topics Concern  . Not on file  Social History Narrative  . Not on file   Social Determinants of Health   Financial Resource Strain: Not on file  Food Insecurity: Not on file  Transportation Needs: Not on file  Physical Activity: Not on file  Stress: Not on file  Social Connections: Not on file      Review of Systems  Constitutional: Negative for activity  change, appetite change and fatigue.  HENT: Negative for congestion, sinus pain, trouble swallowing and voice change.   Eyes: Negative for pain, discharge and visual disturbance.  Respiratory: Negative for cough, chest tightness and shortness of breath.   Cardiovascular: Negative for chest pain and leg swelling.  Gastrointestinal: Negative for abdominal distention, abdominal pain, constipation and diarrhea.  Endocrine: Negative for cold intolerance and heat intolerance.  Genitourinary: Negative for flank pain and testicular pain.  Musculoskeletal: Negative for arthralgias, back pain and neck pain.  Skin: Negative for color change.  Neurological: Negative for dizziness, weakness and headaches.  Hematological: Negative for adenopathy.  Psychiatric/Behavioral: Negative for agitation, confusion and suicidal ideas.     Vital Signs: BP 116/81   Pulse 75   Temp 97.6 F (36.4 C)   Resp 14   Ht 6' (1.829 m)   Wt 220 lb (99.8 kg)   SpO2 97%   BMI 29.84 kg/m    Physical Exam Constitutional:      Appearance: Normal appearance.  HENT:     Head: Normocephalic.     Right Ear: Tympanic membrane normal.     Left Ear: Tympanic membrane normal.     Nose: Nose normal.     Mouth/Throat:     Mouth: Mucous membranes are moist.     Pharynx: No oropharyngeal exudate or posterior oropharyngeal erythema.  Eyes:     General:        Right eye: No discharge.        Left eye: No discharge.     Extraocular Movements: Extraocular movements intact.     Pupils: Pupils are equal, round, and reactive to light.  Cardiovascular:     Rate and Rhythm: Normal rate and regular rhythm.     Pulses: Normal pulses.     Heart sounds: Normal heart sounds. No murmur heard.   Pulmonary:     Effort: Pulmonary effort is normal. No respiratory distress.     Breath sounds: Normal breath sounds. No wheezing or rhonchi.  Abdominal:     General: Abdomen is flat. Bowel sounds are normal. There is no distension.      Palpations: There is no mass.     Tenderness: There is no abdominal tenderness. There is no guarding.     Hernia: No hernia is present.  Musculoskeletal:        General: No swelling or deformity. Normal range of motion.     Cervical back: Normal range of motion.  Skin:    General: Skin is warm and dry.     Capillary Refill: Capillary refill takes less than 2 seconds.  Neurological:  General: No focal deficit present.     Mental Status: He is alert.     Cranial Nerves: No cranial nerve deficit.     Gait: Gait normal.  Psychiatric:        Mood and Affect: Mood normal.        Behavior: Behavior normal.        Judgment: Judgment normal.      LABS: Recent Results (from the past 2160 hour(s))  CMP12+LP+TP+TSH+6AC+PSA+CBC.     Status: Abnormal   Collection Time: 02/21/21 10:30 AM  Result Value Ref Range   Glucose 84 65 - 99 mg/dL   Uric Acid 4.6 3.8 - 8.4 mg/dL    Comment:            Therapeutic target for gout patients: <6.0   BUN 15 6 - 24 mg/dL   Creatinine, Ser 0.91 0.76 - 1.27 mg/dL   eGFR 97 >59 mL/min/1.73   BUN/Creatinine Ratio 16 9 - 20   Sodium 138 134 - 144 mmol/L   Potassium 4.4 3.5 - 5.2 mmol/L   Chloride 101 96 - 106 mmol/L   Calcium 9.6 8.7 - 10.2 mg/dL   Phosphorus 3.3 2.8 - 4.1 mg/dL   Total Protein 7.4 6.0 - 8.5 g/dL   Albumin 4.6 3.8 - 4.9 g/dL   Globulin, Total 2.8 1.5 - 4.5 g/dL   Albumin/Globulin Ratio 1.6 1.2 - 2.2   Bilirubin Total 0.7 0.0 - 1.2 mg/dL   Alkaline Phosphatase 73 44 - 121 IU/L   LDH 223 121 - 224 IU/L   AST 20 0 - 40 IU/L   ALT 19 0 - 44 IU/L   GGT 14 0 - 65 IU/L   Iron 100 38 - 169 ug/dL   Cholesterol, Total 142 100 - 199 mg/dL   Triglycerides 68 0 - 149 mg/dL   HDL 52 >39 mg/dL   VLDL Cholesterol Cal 14 5 - 40 mg/dL   LDL Chol Calc (NIH) 76 0 - 99 mg/dL   Chol/HDL Ratio 2.7 0.0 - 5.0 ratio    Comment:                                   T. Chol/HDL Ratio                                             Men  Women                                1/2 Avg.Risk  3.4    3.3                                   Avg.Risk  5.0    4.4                                2X Avg.Risk  9.6    7.1                                3X Avg.Risk 23.4   11.0    Estimated  CHD Risk  < 0.5 0.0 - 1.0 times avg.    Comment: The CHD Risk is based on the T. Chol/HDL ratio. Other factors affect CHD Risk such as hypertension, smoking, diabetes, severe obesity, and family history of premature CHD.    TSH 3.590 0.450 - 4.500 uIU/mL   T4, Total 7.9 4.5 - 12.0 ug/dL   T3 Uptake Ratio 23 (L) 24 - 39 %   Free Thyroxine Index 1.8 1.2 - 4.9   Prostate Specific Ag, Serum 0.5 0.0 - 4.0 ng/mL    Comment: Roche ECLIA methodology. According to the American Urological Association, Serum PSA should decrease and remain at undetectable levels after radical prostatectomy. The AUA defines biochemical recurrence as an initial PSA value 0.2 ng/mL or greater followed by a subsequent confirmatory PSA value 0.2 ng/mL or greater. Values obtained with different assay methods or kits cannot be used interchangeably. Results cannot be interpreted as absolute evidence of the presence or absence of malignant disease.    WBC 6.9 3.4 - 10.8 x10E3/uL   RBC 5.39 4.14 - 5.80 x10E6/uL   Hemoglobin 16.0 13.0 - 17.7 g/dL   Hematocrit 47.9 37.5 - 51.0 %   MCV 89 79 - 97 fL   MCH 29.7 26.6 - 33.0 pg   MCHC 33.4 31.5 - 35.7 g/dL   RDW 13.1 11.6 - 15.4 %   Platelets 219 150 - 450 x10E3/uL   Neutrophils 58 Not Estab. %   Lymphs 30 Not Estab. %   Monocytes 10 Not Estab. %   Eos 1 Not Estab. %   Basos 1 Not Estab. %   Neutrophils Absolute 3.9 1.4 - 7.0 x10E3/uL   Lymphocytes Absolute 2.1 0.7 - 3.1 x10E3/uL   Monocytes Absolute 0.7 0.1 - 0.9 x10E3/uL   EOS (ABSOLUTE) 0.1 0.0 - 0.4 x10E3/uL   Basophils Absolute 0.1 0.0 - 0.2 x10E3/uL   Immature Granulocytes 0 Not Estab. %   Immature Grans (Abs) 0.0 0.0 - 0.1 x10E3/uL  POCT urinalysis dipstick     Status: None   Collection Time:  02/21/21 10:57 AM  Result Value Ref Range   Color, UA Yellow    Clarity, UA Clear    Glucose, UA Negative Negative   Bilirubin, UA Negative    Ketones, UA Negative    Spec Grav, UA 1.015 1.010 - 1.025   Blood, UA Negative    pH, UA 7.0 5.0 - 8.0   Protein, UA Negative Negative   Urobilinogen, UA 0.2 0.2 or 1.0 E.U./dL   Nitrite, UA Negative    Leukocytes, UA Negative Negative   Appearance     Odor       Assessment/Plan: 1. Annual physical exam Up to date on PHM.  Discussed Shingles vaccine, patient will inquire at [facility].   2. Diverticulitis of colon 06/2008 Continue to see GI and keep schedule for colonoscopy.   3. Gastroesophageal reflux disease without esophagitis No changes, continue present management.   4. Mixed hyperlipidemia Lipid panel WNL, continue statin.   5. PAF (paroxysmal atrial fibrillation) (HCC) EKG shows NSR.  Continue with Diltiazem and follow up with Dr. Rockey Situ as scheduled.   6. Adult BMI 29.0-29.9 kg/sq m Encouraged continued exercise and weight loss to meet patients goals.      General Counseling: wake conlee understanding of the findings of todays visit and agrees with plan of treatment. I have discussed any further diagnostic evaluation that may be needed or ordered today. We also reviewed his medications today. he has been  encouraged to call the office with any questions or concerns that should arise related to todays visit.    No orders of the defined types were placed in this encounter.   No orders of the defined types were placed in this encounter.   Total time spent: 40 Minutes  Time spent includes review of chart, medications, test results, and follow up plan with the patient.    Kendell Bane AGNP-C Nurse Practitioner

## 2021-03-10 NOTE — Progress Notes (Signed)
Pt aware of HIV and Hep C screeening and pt denied. Pt will discuss shingles vaccine with provider. CL,RMA

## 2021-03-12 ENCOUNTER — Ambulatory Visit (INDEPENDENT_AMBULATORY_CARE_PROVIDER_SITE_OTHER): Payer: 59 | Admitting: Podiatry

## 2021-03-12 ENCOUNTER — Other Ambulatory Visit: Payer: Self-pay

## 2021-03-12 ENCOUNTER — Encounter: Payer: Self-pay | Admitting: Podiatry

## 2021-03-12 VITALS — Temp 97.7°F

## 2021-03-12 DIAGNOSIS — L03031 Cellulitis of right toe: Secondary | ICD-10-CM

## 2021-03-12 NOTE — Progress Notes (Signed)
He presents today for follow-up of his matrixectomy hallux right.  He says it seems to be doing okay to me.  Objective: There is no erythema edema cellulitis drainage or odor still has a superficial ulceration to the lateral border which does appear to be a small eschar present.  This should only heal up uneventfully I see no signs of infection.  Assessment: Well-healing matrixectomy small eschar present secondary to dilution of the tissue there with the acid.  Plan: I went ahead and recommended that he continue to cover during the day leave open at bedtime continue Cortisporin otic drops and soak every other day or every third day until completely healed.  He will follow-up with any questions or concerns or if this does not go on to heal.

## 2021-03-21 ENCOUNTER — Telehealth: Payer: Self-pay | Admitting: Podiatry

## 2021-03-21 ENCOUNTER — Other Ambulatory Visit: Payer: Self-pay | Admitting: Adult Health

## 2021-03-21 MED ORDER — METRONIDAZOLE 500 MG PO TABS: 500.0000 mg | ORAL_TABLET | Freq: Two times a day (BID) | ORAL | 0 refills | Status: AC

## 2021-03-21 MED ORDER — CIPROFLOXACIN HCL 500 MG PO TABS: 500.0000 mg | ORAL_TABLET | Freq: Two times a day (BID) | ORAL | 0 refills | Status: AC

## 2021-03-21 NOTE — Telephone Encounter (Signed)
Orthotics in [place] to be taken to [facility].. lvm for pt to call to schedule an appt in [place] office and I did have availability on 6.20 in the afternoon..  Given to Dr Prudence Davidson to take to [facility].Marland KitchenMarland Kitchen

## 2021-03-21 NOTE — Progress Notes (Signed)
Patient has history of Diverticulitis and called this morning with symptoms that started last night.  Sending Cipro and Flagyl to patients pharmacy, and nurse reminded patient to seek treatment if symptoms persist or new symptoms develop.    -cipro -flagyl

## 2021-03-24 ENCOUNTER — Other Ambulatory Visit: Payer: Self-pay

## 2021-03-24 ENCOUNTER — Ambulatory Visit (INDEPENDENT_AMBULATORY_CARE_PROVIDER_SITE_OTHER): Payer: 59

## 2021-03-24 DIAGNOSIS — M722 Plantar fascial fibromatosis: Secondary | ICD-10-CM

## 2021-03-24 NOTE — Progress Notes (Signed)
Patient presents for orthotic pick up.  Verbal and written break in and wear instructions given.  Patient will follow up in 4 weeks with Dr if symptoms worsen or fail to improve. 

## 2021-03-24 NOTE — Patient Instructions (Signed)

## 2021-04-02 ENCOUNTER — Other Ambulatory Visit: Payer: Self-pay

## 2021-04-02 ENCOUNTER — Encounter: Payer: Self-pay | Admitting: Adult Health

## 2021-04-02 ENCOUNTER — Ambulatory Visit: Payer: Self-pay | Admitting: Adult Health

## 2021-04-02 VITALS — BP 113/77 | HR 78 | Temp 97.7°F | Resp 14 | Ht 72.0 in | Wt 224.0 lb

## 2021-04-02 DIAGNOSIS — M545 Low back pain, unspecified: Secondary | ICD-10-CM

## 2021-04-02 MED ORDER — CYCLOBENZAPRINE HCL 10 MG PO TABS: 10.0000 mg | ORAL_TABLET | Freq: Every day | ORAL | 0 refills | Status: DC

## 2021-04-02 NOTE — Progress Notes (Signed)
[facility] [place],  [address]   Office Visit Note  Patient Name: Jesus Kent  [id number]  [id number]  Date of Service: 04/02/2021  Chief Complaint  Patient presents with   Back Pain    Pt back started hurting Saturday morning with no injury. Pain scale 1-10 is on a 2, but when he moves a certain way the pain is there. Pt states he's been taking advil 2 a day. It does help some. CL,RMA     Back Pain Pertinent negatives include no numbness.  Pt is here for a sick visit.  He reports low back pain that he noticed 4 days ago.  It became a little worse through that day, and has mostly lingered.  He has been able to do most of what he needs to do, but it will "catch" at times.       Current Medication:  Outpatient Encounter Medications as of 04/02/2021  Medication Sig   aspirin 81 MG EC tablet Take by mouth.   atorvastatin (LIPITOR) 10 MG tablet Take 1 tablet (10 mg total) by mouth daily.   Cholecalciferol 25 MCG (1000 UT) tablet Take 1,000 Units by mouth daily.   co-enzyme Q-10 30 MG capsule Take 50 mg by mouth daily.   cyclobenzaprine (FLEXERIL) 10 MG tablet Take 1 tablet (10 mg total) by mouth at bedtime.   diltiazem (CARDIZEM CD) 180 MG 24 hr capsule TAKE 1 CAPSULE BY MOUTH EVERY DAY   magnesium oxide (MAG-OX) 400 MG tablet Take 400 mg by mouth daily.   metoprolol tartrate (LOPRESSOR) 25 MG tablet TAKE 1 TABLET BY MOUTH TWICE A DAY AS NEEDED   MULTIPLE VITAMINS PO Take 1 tablet by mouth daily.   Turmeric 500 MG TABS Take by mouth as directed.   Zinc Sulfate (ZINC 15 PO) Take by mouth.   [DISCONTINUED] meloxicam (MOBIC) 15 MG tablet TAKE 1 TABLET BY MOUTH EVERY DAY (Patient taking differently: As needed)   [DISCONTINUED] NEOMYCIN-POLYMYXIN-HYDROCORTISONE (CORTISPORIN) 1 % SOLN OTIC solution Apply 1-2 drops to toe BID after soaking   No facility-administered encounter medications on file as of 04/02/2021.      Medical History: Past Medical  History:  Diagnosis Date   Atrial fibrillation ([place])    Herpes simplex type 2 infection    [facility]   Hyperlipidemia      Vital Signs: BP 113/77   Pulse 78   Temp 97.7 F (36.5 C)   Resp 14   Ht 6' (1.829 m)   Wt 224 lb (101.6 kg)   SpO2 97%   BMI 30.38 kg/m    Review of Systems  Musculoskeletal:  Positive for back pain.  Neurological:  Negative for numbness.   Physical Exam Constitutional:      Appearance: He is normal weight.  HENT:     Head: Normocephalic.  Musculoskeletal:        General: No swelling, deformity or signs of injury.     Right lower leg: No edema.     Left lower leg: No edema.     Comments: Pt has tenderness with palpation in low back, lateral to spine.   Neurological:     General: No focal deficit present.     Mental Status: He is alert and oriented to person, place, and time.     Sensory: No sensory deficit.     Motor: No weakness.     Gait: Gait normal.      Assessment/Plan:  1. Acute right-sided low back pain without sciatica The patient is advised to apply heat intermittently (avoid sleeping on heating pad). Take NSAIDs as discussed.  May use flexeril at bedtime.  Follow up symptoms fail to improve.  - cyclobenzaprine (FLEXERIL) 10 MG tablet; Take 1 tablet (10 mg total) by mouth at bedtime.  Dispense: 20 tablet; Refill: 0     General Counseling: [name] verbalizes understanding of the findings of todays visit and agrees with plan of treatment. I have discussed any further diagnostic evaluation that may be needed or ordered today. We also reviewed his medications today. he has been encouraged to call the office with any questions or concerns that should arise related to todays visit.   No orders of the defined types were placed in this encounter.   Meds ordered this encounter  Medications   cyclobenzaprine (FLEXERIL) 10 MG tablet    Sig: Take 1 tablet (10 mg total) by mouth at bedtime.    Dispense:  20 tablet    Refill:   0    Time spent:20 Minutes    Kendell Bane AGNP-C Nurse Practitioner

## 2021-05-05 ENCOUNTER — Ambulatory Visit: Payer: 59 | Admitting: Podiatry

## 2021-05-09 ENCOUNTER — Other Ambulatory Visit: Payer: Self-pay

## 2021-05-09 DIAGNOSIS — R1032 Left lower quadrant pain: Secondary | ICD-10-CM

## 2021-05-09 DIAGNOSIS — K5732 Diverticulitis of large intestine without perforation or abscess without bleeding: Secondary | ICD-10-CM

## 2021-05-09 MED ORDER — METRONIDAZOLE 500 MG PO TABS: 500.0000 mg | ORAL_TABLET | Freq: Two times a day (BID) | ORAL | 0 refills | Status: DC

## 2021-05-09 MED ORDER — CIPROFLOXACIN HCL 500 MG PO TABS: 500.0000 mg | ORAL_TABLET | Freq: Two times a day (BID) | ORAL | 0 refills | Status: DC

## 2021-06-11 ENCOUNTER — Emergency Department: Payer: 59

## 2021-06-11 ENCOUNTER — Other Ambulatory Visit: Payer: Self-pay

## 2021-06-11 ENCOUNTER — Encounter (HOSPITAL_BASED_OUTPATIENT_CLINIC_OR_DEPARTMENT_OTHER): Payer: Self-pay

## 2021-06-11 ENCOUNTER — Emergency Department
Admission: EM | Admit: 2021-06-11 | Discharge: 2021-06-11 | Disposition: A | Discharge status: Home / Self Care | Payer: 59 | Attending: Emergency Medicine | Admitting: Emergency Medicine

## 2021-06-11 ENCOUNTER — Other Ambulatory Visit: Payer: Self-pay | Admitting: Cardiovascular Disease

## 2021-06-11 DIAGNOSIS — I499 Cardiac arrhythmia, unspecified: Secondary | ICD-10-CM | POA: Diagnosis not present

## 2021-06-11 DIAGNOSIS — I493 Ventricular premature depolarization: Secondary | ICD-10-CM | POA: Diagnosis not present

## 2021-06-11 DIAGNOSIS — R079 Chest pain, unspecified: Secondary | ICD-10-CM | POA: Diagnosis not present

## 2021-06-11 DIAGNOSIS — Z7982 Long term (current) use of aspirin: Secondary | ICD-10-CM | POA: Diagnosis not present

## 2021-06-11 DIAGNOSIS — Z96649 Presence of unspecified artificial hip joint: Secondary | ICD-10-CM | POA: Diagnosis not present

## 2021-06-11 LAB — TROPONIN I (HIGH SENSITIVITY)
Troponin I (High Sensitivity): 3 ng/L (ref <18)
Troponin I (High Sensitivity): 3 ng/L

## 2021-06-11 LAB — BASIC METABOLIC PANEL
Anion gap: 8 (ref 5–15)
BUN: 19 mg/dL (ref 6–20)
CO2: 25 mmol/L (ref 22–32)
Calcium: 9.2 mg/dL (ref 8.9–10.3)
Chloride: 106 mmol/L (ref 98–111)
Creatinine, Ser: 1.06 mg/dL (ref 0.61–1.24)
GFR, Estimated: >60 mL/min (ref >60)
Glucose, Bld: 97 mg/dL (ref 70–99)
Potassium: 4 mmol/L (ref 3.5–5.1)
Sodium: 139 mmol/L (ref 135–145)

## 2021-06-11 LAB — CBC
HCT: 44.9 % (ref 39.0–52.0)
Hemoglobin: 16 g/dL (ref 13.0–17.0)
MCH: 31.4 pg (ref 26.0–34.0)
MCHC: 35.6 g/dL (ref 30.0–36.0)
MCV: 88 fL (ref 80.0–100.0)
Platelets: 193 10*3/uL (ref 150–400)
RBC: 5.1 MIL/uL (ref 4.22–5.81)
RDW: 12.9 % (ref 11.5–15.5)
WBC: 9.7 10*3/uL (ref 4.0–10.5)
nRBC: 0 % (ref 0.0–0.2)

## 2021-06-11 LAB — MAGNESIUM: Magnesium: 2.2 mg/dL (ref 1.7–2.4)

## 2021-06-11 NOTE — Discharge Instructions (Addendum)
Recommend that you cut down on your caffeine intake.  Please continue your medications as prescribed.  I recommend close follow-up with your cardiologist.  Your cardiac labs today were reassuring.

## 2021-06-11 NOTE — ED Provider Notes (Signed)
[facility] Emergency Department Provider Note  ____________________________________________   Event Date/Time   First MD Initiated Contact with Patient 06/11/21 [phone]     (approximate)  I have reviewed the triage vital signs and the nursing notes.   HISTORY  Chief Complaint Chest Pain    HPI Jesus Kent is a 60 y.o. male with history of atrial fibrillation, hyperlipidemia who presents to the emergency department with complaints of palpitations today.  States that his iPhone picked up that he was having occasional PVCs.  States that he would have intermittent regular beats in between the PVCs.  He states he did have a little bit of left-sided chest discomfort but no shortness of breath, dizziness, near syncope.  States he thinks this could be due to drinking more caffeine than he is used to today.  States he normally drinks noncaffeinated beverages but today he had caffeine with each meal.  He is on diltiazem and metoprolol.  He is followed by Dr. Rockey Situ with cardiology.  He denies any vomiting, diarrhea.        Past Medical History:  Diagnosis Date   Atrial fibrillation ([place])    Herpes simplex type 2 infection    [facility]   Hyperlipidemia     Patient Active Problem List   Diagnosis Date Noted   Diverticulitis of colon 06/2008 08/13/2020   Seasonal rhinitis 08/13/2020   GERD (gastroesophageal reflux disease) 08/13/2020   PAF (paroxysmal atrial fibrillation) ([place]) 12/10/2019   Hyperlipidemia 12/10/2019   Bradycardia 07/20/2018   History of right hip replacement 05/28/2017   Obesity (BMI 30-39.9) 04/20/2017   Follow-up examination after orthopedic surgery 03/16/2017   Osteoarthritis of elbow 09/04/2013   Ulnar nerve entrapment at elbow 09/04/2013   Heterotopic ossification of bone 04/06/2013    Past Surgical History:  Procedure Laterality Date   COLONOSCOPY WITH PROPOFOL N/A 05/26/2016   Procedure: COLONOSCOPY WITH PROPOFOL;   Surgeon: [name], MD;  Location: [facility] ENDOSCOPY;  Service: Endoscopy;  Laterality: N/A;   JOINT REPLACEMENT     knee surgery x3 Right    TOTAL HIP ARTHROPLASTY      Prior to Admission medications   Medication Sig Start Date End Date Taking? Authorizing Provider  aspirin 81 MG EC tablet Take by mouth. 05/11/17   [provider]  atorvastatin (LIPITOR) 10 MG tablet Take 1 tablet (10 mg total) by mouth daily. 10/28/20   Minna Merritts, MD  Cholecalciferol 25 MCG (1000 UT) tablet Take 1,000 Units by mouth daily.    [provider]  ciprofloxacin (CIPRO) 500 MG tablet Take 1 tablet (500 mg total) by mouth 2 (two) times daily. 05/09/21   Sable Feil, PA-C  co-enzyme Q-10 30 MG capsule Take 50 mg by mouth daily.    [provider]  cyclobenzaprine (FLEXERIL) 10 MG tablet Take 1 tablet (10 mg total) by mouth at bedtime. 04/02/21   Kendell Bane, NP  diltiazem (CARDIZEM CD) 180 MG 24 hr capsule TAKE 1 CAPSULE BY MOUTH EVERY DAY 01/22/21   Minna Merritts, MD  magnesium oxide (MAG-OX) 400 MG tablet Take 400 mg by mouth daily.    [provider]  metoprolol tartrate (LOPRESSOR) 25 MG tablet TAKE 1 TABLET BY MOUTH TWICE A DAY AS NEEDED 12/05/20   Minna Merritts, MD  metroNIDAZOLE (FLAGYL) 500 MG tablet Take 1 tablet (500 mg total) by mouth 2 (two) times daily. 05/09/21   Sable Feil, PA-C  MULTIPLE VITAMINS PO Take 1 tablet by mouth daily.    [provider]  Turmeric 500 MG TABS Take by mouth as directed.    [provider]  Zinc Sulfate (ZINC 15 PO) Take by mouth.    [provider]    Allergies Patient has no known allergies.  Family History  Problem Relation Age of Onset   Hyperlipidemia Mother    Heart disease Father        stent     Social History Social History   Tobacco Use   Smoking status: Never   Smokeless tobacco: Former  Scientific laboratory technician Use: Never used  Substance Use Topics   Alcohol use:  Never    Comment: rare   Drug use: No    Review of Systems Constitutional: No fever. Eyes: No visual changes. ENT: No sore throat. Cardiovascular:+ Palpitations and chest pain. Respiratory: Denies shortness of breath. Gastrointestinal: No nausea, vomiting, diarrhea. Genitourinary: Negative for dysuria. Musculoskeletal: Negative for back pain. Skin: Negative for rash. Neurological: Negative for focal weakness or numbness.  ____________________________________________   PHYSICAL EXAM:  VITAL SIGNS: ED Triage Vitals  Enc Vitals Group     BP 06/11/21 0109 (!) 134/91     Pulse Rate 06/11/21 0109 65     Resp 06/11/21 0109 20     Temp 06/11/21 0109 98.8 F (37.1 C)     Temp Source 06/11/21 0109 Oral     SpO2 06/11/21 0109 97 %     Weight 06/11/21 0113 224 lb (101.6 kg)     Height 06/11/21 0113 6' (1.829 m)     Head Circumference --      Peak Flow --      Pain Score 06/11/21 0113 1     Pain Loc --      Pain Edu? --      Excl. in [place]? --    CONSTITUTIONAL: Alert and oriented and responds appropriately to questions. Well-appearing; well-nourished HEAD: Normocephalic EYES: Conjunctivae clear, pupils appear equal, EOM appear intact ENT: normal nose; moist mucous membranes NECK: Supple, normal ROM CARD: RRR; occasional PVCs noted on cardiac monitoring, S1 and S2 appreciated; no murmurs, no clicks, no rubs, no gallops RESP: Normal chest excursion without splinting or tachypnea; breath sounds clear and equal bilaterally; no wheezes, no rhonchi, no rales, no hypoxia or respiratory distress, speaking full sentences ABD/GI: Normal bowel sounds; non-distended; soft, non-tender, no rebound, no guarding, no peritoneal signs, no hepatosplenomegaly BACK: The back appears normal EXT: Normal ROM in all joints; no deformity noted, no edema; no cyanosis, no calf tenderness or calf swelling SKIN: Normal color for age and race; warm; no rash on exposed skin NEURO: Moves all extremities equally,  normal speech, normal gait PSYCH: The patient's mood and manner are appropriate.  ____________________________________________   LABS (all labs ordered are listed, but only abnormal results are displayed)  Labs Reviewed  BASIC METABOLIC PANEL  CBC  MAGNESIUM  TROPONIN I (HIGH SENSITIVITY)  TROPONIN I (HIGH SENSITIVITY)   ____________________________________________  EKG    EKG Interpretation  Date/Time:  Wednesday June 11 2021 01:08:04 EDT Ventricular Rate:  64 PR Interval:  156 QRS Duration: 102 QT Interval:  396 QTC Calculation: 408 R Axis:   -4 Text Interpretation: Sinus rhythm with frequent Premature ventricular complexes Otherwise normal ECG Confirmed by Pryor Curia [id number]) on 06/11/2021 5:22:42 AM        ____________________________________________  RADIOLOGY Jessie Foot Kona Yusuf, personally viewed and evaluated these images (plain  radiographs) as part of my medical decision making, as well as reviewing the written report by the radiologist.  ED MD interpretation: Chest x-ray clear.  Official radiology report(s): DG Chest 2 View  Result Date: 06/11/2021 CLINICAL DATA:  Chest pain EXAM: CHEST - 2 VIEW COMPARISON:  12/29/2017 FINDINGS: Heart and mediastinal contours are within normal limits. No focal opacities or effusions. No acute bony abnormality. IMPRESSION: No active cardiopulmonary disease. Electronically Signed   By: Rolm Baptise M.D.   On: 06/11/2021 01:27    ____________________________________________   PROCEDURES  Procedure(s) performed (including Critical Care):  .1-3 Lead EKG Interpretation  Date/Time: 06/11/2021 5:46 AM Performed by: Reanne Nellums, Delice Bison, DO Authorized by: Barrie Wale, Delice Bison, DO     Interpretation: normal     ECG rate:  63   ECG rate assessment: normal     Rhythm: sinus rhythm     Ectopy: PVCs     Conduction: normal      ____________________________________________   INITIAL IMPRESSION / ASSESSMENT AND PLAN / ED  COURSE  As part of my medical decision making, I reviewed the following data within the [place] notes reviewed and incorporated, Labs reviewed , EKG interpreted , Old EKG reviewed, Old chart reviewed, Radiograph reviewed , and Notes from prior ED visits         Patient here with intermittent palpitations.  Patient is having occasional PVCs.  No other ectopy noted.  EKG nonischemic.  In a sinus rhythm.  Not currently in atrial fibrillation.  Troponin x2 negative.  Doubt PE, dissection.  Potassium normal.  Hemoglobin normal.  We will add on magnesium level.  We will continue to monitor closely.  Suspect that symptoms may be secondary to significantly increased caffeine intake today and he agrees.  ED PROGRESS  Patient's magnesium level is normal.  Continues to have occasional PVCs on monitoring but no other ectopy, arrhythmia.  Recommended he cut down on his caffeine intake and follow-up with his cardiologist.  I do not feel he needs medication changes at this time specially given he has had heart rates in the low 60s and upper 50s.  We discussed that increasing his beta-blocker could make him more symptomatic if he was increasingly bradycardic.  He is comfortable with this plan.  We did discuss return precautions.  Will discharge home.   At this time, I do not feel there is any life-threatening condition present. I have reviewed, interpreted and discussed all results (EKG, imaging, lab, urine as appropriate) and exam findings with patient/family. I have reviewed nursing notes and appropriate previous records.  I feel the patient is safe to be discharged home without further emergent workup and can continue workup as an outpatient as needed. Discussed usual and customary return precautions. Patient/family verbalize understanding and are comfortable with this plan.  Outpatient follow-up has been provided as needed. All questions have been  answered.  ____________________________________________   FINAL CLINICAL IMPRESSION(S) / ED DIAGNOSES  Final diagnoses:  PVC's (premature ventricular contractions)     ED Discharge Orders     None       *Please note:  AUSTINMICHAEL MICHELIN was evaluated in Emergency Department on 06/11/2021 for the symptoms described in the history of present illness. He was evaluated in the context of the global COVID-19 pandemic, which necessitated consideration that the patient might be at risk for infection with the SARS-CoV-2 virus that causes COVID-19. Institutional protocols and algorithms that pertain to the evaluation of patients at risk  for COVID-19 are in a state of rapid change based on information released by regulatory bodies including the CDC and federal and state organizations. These policies and algorithms were followed during the patient's care in the ED.  Some ED evaluations and interventions may be delayed as a result of limited staffing during and the pandemic.*   Note:  This document was prepared using Dragon voice recognition software and may include unintentional dictation errors.    Kishaun Erekson, Delice Bison, DO 06/11/21 [phone]

## 2021-06-11 NOTE — ED Triage Notes (Signed)
Pt arrived via POV with reports of feeling a flutter feeling in his chest and throat, denies any dizziness, shortness of breath. Pt reports that he does have a discomfort on the L side of his chest.  Pt has hx of Afib and PVCs.

## 2021-06-20 DIAGNOSIS — Z09 Encounter for follow-up examination after completed treatment for conditions other than malignant neoplasm: Secondary | ICD-10-CM | POA: Diagnosis not present

## 2021-06-20 DIAGNOSIS — Z96641 Presence of right artificial hip joint: Secondary | ICD-10-CM | POA: Diagnosis not present

## 2021-06-20 DIAGNOSIS — Z96642 Presence of left artificial hip joint: Secondary | ICD-10-CM | POA: Diagnosis not present

## 2021-06-23 ENCOUNTER — Ambulatory Visit (INDEPENDENT_AMBULATORY_CARE_PROVIDER_SITE_OTHER): Payer: 59 | Admitting: Surgery

## 2021-06-23 ENCOUNTER — Other Ambulatory Visit: Payer: Self-pay

## 2021-06-23 ENCOUNTER — Encounter: Payer: Self-pay | Admitting: Surgery

## 2021-06-23 VITALS — BP 114/79 | HR 81 | Temp 97.9°F | Ht 72.0 in | Wt 227.4 lb

## 2021-06-23 DIAGNOSIS — K645 Perianal venous thrombosis: Secondary | ICD-10-CM | POA: Diagnosis not present

## 2021-06-23 DIAGNOSIS — K642 Third degree hemorrhoids: Secondary | ICD-10-CM | POA: Diagnosis not present

## 2021-06-23 MED ORDER — HYDROCORTISONE (PERIANAL) 2.5 % EX CREA: 1.0000 "application " | TOPICAL_CREAM | Freq: Two times a day (BID) | CUTANEOUS | 0 refills | Status: DC

## 2021-06-23 MED ORDER — LIDOCAINE 5 % EX OINT: 1.0000 "application " | TOPICAL_OINTMENT | CUTANEOUS | 0 refills | Status: DC | PRN

## 2021-06-23 NOTE — Patient Instructions (Addendum)
Stop your Aspirin for one week. You may take Ibuprofen 600 mg, or Tylenol 1000mg  every 6-8 hours as needed.   Call us if you need anything stronger.   Change the gauze at least once a day more if needed until the area stops draining. Do several sitz baths a day especially after a bowel movement.   You want to be sure that your bowel movements are soft and easy. You may take Miralax 1 capful in a full glass of liquid every day. You may also take stool softeners once a day for the next week.    How to Take a [employer] A sitz bath is a warm water bath that may be used to care for your rectum, genital area, or the area between your rectum and genitals (perineum). In a sitz bath, the water only comes up to your hips and covers your buttocks. A sitz bath may be done in a bathtub or with a portable sitz bath that fits over the toilet. Your health care provider may recommend a sitz bath to help: Relieve pain and discomfort after delivering a baby. Relieve pain and itching from hemorrhoids or anal fissures. Relieve pain after certain surgeries. Relax muscles that are sore or tight. How to take a sitz bath Take 3-4 sitz baths a day, or as many as told by your health care provider. Bathtub sitz bath To take a sitz bath in a bathtub: Partially fill a bathtub with warm water. The water should be deep enough to cover your hips and buttocks when you are sitting in the tub. Follow your health care provider's instructions if you are told to put medicine in the water. Sit in the water. Open the tub drain a little, and leave it open during your bath. Turn on the warm water again, enough to replace the water that is draining out. Keep the water running throughout your bath. This helps keep the water at the right level and temperature. Soak in the water for 15-20 minutes, or as long as told by your health care provider. When you are done, be careful when you stand up. You may feel dizzy. After the sitz bath,  pat yourself dry. Do not rub your skin to dry it.  Over-the-toilet sitz bath To take a sitz bath with an over-the-toilet basin: Follow the manufacturer's instructions. Fill the basin with warm water. Follow your health care provider's instructions if you were told to put medicine in the water. Sit on the seat. Make sure the water covers your buttocks and perineum. Soak in the water for 15-20 minutes, or as long as told by your health care provider. After the sitz bath, pat yourself dry. Do not rub your skin to dry it. Clean and dry the basin between uses. Discard the basin if it cracks, or according to the manufacturer's instructions.  Contact a health care provider if: Your pain or itching gets worse. Do not continue with sitz baths if your symptoms get worse. You have new symptoms. Do not continue with sitz baths until you talk with your health care provider. Summary A sitz bath is a warm water bath in which the water only comes up to your hips and covers your buttocks. A sitz bath may help relieve pain and discomfort after delivering a baby. It also may help with pain and itching from hemorrhoids or anal fissures, or pain after certain surgeries. It can also help to relax muscles that are sore or tight. Take 3-4 sitz baths  a day, or as many as told by your health care provider. Soak in the water for 15-20 minutes. Do not continue with sitz baths if your symptoms get worse. This information is not intended to replace advice given to you by your health care provider. Make sure you discuss any questions you have with your health care provider. Document Revised: 06/06/2020 Document Reviewed: 06/06/2020 Elsevier Patient Education  2022 [employer].

## 2021-06-23 NOTE — Progress Notes (Signed)
06/23/2021  Reason for Visit:  Thrombosed external hemorrhoid  History of Present Illness: Jesus Kent is a 60 y.o. male presenting for evaluation of a thrombosed external hemorrhoid.  He presented to urgent care earlier today due to perianal pain and bleeding.  Patient reports that last week on 06/17/2021, he noted that she developed an area of swelling that was small in size in the perianal region.  This slowly became worse and on Saturday, 06/21/2021, it became much larger and harder and started bleeding.  The bleeding got better the next day and today given the persistent although milder oozing, he presented to urgent care for further evaluation.  On their exam, he was found to have a thrombosed external hemorrhoid and was referred to [place] urgently for incision.  He denies having any fevers, chills, chest pain, shortness of breath.  He reports that he has had an issue with an external hemorrhoid flareup about a year ago but it was less intense in severity and there was no bleeding or thrombosed hemorrhoid.  It went away with Preparation H only.  He denies any constipation, straining, hard stool.  He has been doing sitz baths which have been helping.  Past Medical History: Past Medical History:  Diagnosis Date   Atrial fibrillation ([place])    Herpes simplex type 2 infection    [facility]   Hyperlipidemia      Past Surgical History: Past Surgical History:  Procedure Laterality Date   COLONOSCOPY WITH PROPOFOL N/A 05/26/2016   Procedure: COLONOSCOPY WITH PROPOFOL;  Surgeon: [name], MD;  Location: [facility] ENDOSCOPY;  Service: Endoscopy;  Laterality: N/A;   JOINT REPLACEMENT     knee surgery x3 Right    TOTAL HIP ARTHROPLASTY      Home Medications: Prior to Admission medications   Medication Sig Start Date End Date Taking? Authorizing Provider  aspirin 81 MG EC tablet Take by mouth. 05/11/17  Yes [provider]  atorvastatin (LIPITOR) 10 MG tablet Take 1 tablet  (10 mg total) by mouth daily. 10/28/20  Yes Minna Merritts, MD  Cholecalciferol 25 MCG (1000 UT) tablet Take 1,000 Units by mouth daily.   Yes [provider]  co-enzyme Q-10 30 MG capsule Take 50 mg by mouth daily.   Yes [provider]  diltiazem (CARDIZEM CD) 180 MG 24 hr capsule TAKE 1 CAPSULE BY MOUTH EVERY DAY 01/22/21  Yes Gollan, Kathlene November, MD  hydrocortisone (ANUSOL-HC) 2.5 % rectal cream Place 1 application rectally 2 (two) times daily. 06/23/21  Yes Ashara Lounsbury, MD  lidocaine (XYLOCAINE) 5 % ointment Apply 1 application topically as needed. 06/23/21  Yes Veva Grimley, Jacqulyn Bath, MD  magnesium oxide (MAG-OX) 400 MG tablet Take 400 mg by mouth daily.   Yes [provider]  metoprolol tartrate (LOPRESSOR) 25 MG tablet TAKE 1 TABLET BY MOUTH TWICE A DAY AS NEEDED 06/11/21  Yes Gollan, Kathlene November, MD  MULTIPLE VITAMINS PO Take 1 tablet by mouth daily.   Yes [provider]  Omega-3 Fatty Acids (FISH OIL) 1000 MG CAPS Take by mouth.   Yes [provider]  Zinc Sulfate (ZINC 15 PO) Take by mouth.   Yes [provider]    Allergies: No Known Allergies  Social History:  reports that he has never smoked. He has quit using smokeless tobacco. He reports that he does not drink alcohol and does not use drugs.   Family History: Family History  Problem Relation Age of Onset  Hyperlipidemia Mother    Heart disease Father        stent     Review of Systems: Review of Systems  Constitutional:  Negative for chills and fever.  HENT:  Negative for hearing loss.   Respiratory:  Negative for shortness of breath.   Cardiovascular:  Negative for chest pain.  Gastrointestinal:  Positive for blood in stool. Negative for abdominal pain, constipation, diarrhea, nausea and vomiting.  Genitourinary:  Negative for dysuria.  Musculoskeletal:  Negative for myalgias.  Skin:  Negative for rash.  Neurological:  Negative for dizziness.  Psychiatric/Behavioral:   Negative for depression.    Physical Exam BP 114/79   Pulse 81   Temp 97.9 F (36.6 C) (Oral)   Ht 6' (1.829 m)   Wt 227 lb 6.4 oz (103.1 kg)   SpO2 96%   BMI 30.84 kg/m  CONSTITUTIONAL: No acute distress, well-nourished HEENT:  Normocephalic, atraumatic, extraocular motion intact. NECK: Trachea is midline, and there is no jugular venous distension.  RESPIRATORY:  Normal respiratory effort without pathologic use of accessory muscles. CARDIOVASCULAR: Regular rhythm and rate. GI: The abdomen is soft, obese, nondistended.  RECTAL: External exam reveals thrombosed enlarged left lateral external hemorrhoid with an area of clot which appears to be the area that had been draining spontaneously.  It is tender to palpation. MUSCULOSKELETAL:  Normal muscle strength and tone in all four extremities.  No peripheral edema or cyanosis. SKIN: Skin turgor is normal. There are no pathologic skin lesions.  NEUROLOGIC:  Motor and sensation is grossly normal.  Cranial nerves are grossly intact. PSYCH:  Alert and oriented to person, place and time. Affect is normal.  Laboratory Analysis: No results found for this or any previous visit (from the past 24 hour(s)).  Imaging: No results found.  Assessment and Plan: This is a 60 y.o. male with a thrombosed external hemorrhoid.  - Discussed with the patient that he is currently within the 72 hours given that his symptoms worsen on 9/17.  I can still see that there is clot within the external hemorrhoid and at the he would benefit from incision and drainage and evacuation of the clot.  Discussed with him the risks of bleeding, infection, injury to surrounding structures, and he is willing to proceed.  Discussed with him the procedure at length including cleansing of the skin, numbing, incision, and evacuation.  No packing dressings will be needed and should only need to apply dry gauze dressing over the perianal area to catch any drainage.  Also recommended  that he stop his aspirin for a week so that the bleeding can resolve on its own as well.   Procedure Date:  06/23/2021  Pre-operative Diagnosis: Thrombosed left lateral external hemorrhoid  Post-operative Diagnosis: Thrombosed left lateral external hemorrhoid  Procedure:  Incision and Drainage of thrombosed left lateral external hemorrhoid  Surgeon:  Melvyn Neth, MD  Assistant:  Wendall Stade, PA-S  Anesthesia:  5 ml of 1% lidocaine with epi  Estimated Blood Loss:  2 ml  Specimens:  None  Complications:  None  Indications for Procedure:  This is a 60 y.o. male with diagnosis of thrombosed external hemorrhoid abscess, requiring drainage procedure.  The risks of bleeding, abscess or infection, injury to surrounding structures, and need for further procedures were all discussed with the patient and was willing to proceed.  Description of Procedure: The patient was correctly identified at bedside.  Appropriate time-outs were performed prior to procedure.  The patient's perianal area  was prepped and draped in usual sterile fashion.  Local anesthetic was infused intradermally over the thrombosed hemorrhoid.  A 1 cm incision was made over the thrombosed hemorrhoid, over the area of previous drainage, revealing blood clots.  All the blood clots were evacuated with manual pressure.  After drainage and evacuation was completed, the cavity was irrigated and cleaned.  The wound was left open and dressed with 4 x 4 gauze.  The patient tolerated the procedure well and all sharps were appropriately disposed of at the end of the case.  --Patient may take Tylenol or ibuprofen for pain control. - Recommended that he continue doing sitz bath's to help with soothing the tissue, particularly after bowel movements. - We will give a prescription for Anusol and lidocaine ointment to help with pain control and inflammation. - Follow-up at the end of the week for wound check.    Face-to-face time spent  with the patient and care providers was 45 minutes, with more than 50% of the time spent counseling, educating, and coordinating care of the patient.     Melvyn Neth, [facility]

## 2021-06-27 ENCOUNTER — Ambulatory Visit (INDEPENDENT_AMBULATORY_CARE_PROVIDER_SITE_OTHER): Payer: 59 | Admitting: Surgery

## 2021-06-27 ENCOUNTER — Encounter: Payer: Self-pay | Admitting: Surgery

## 2021-06-27 ENCOUNTER — Other Ambulatory Visit: Payer: Self-pay

## 2021-06-27 VITALS — BP 115/75 | HR 76 | Temp 98.0°F | Ht 72.0 in | Wt 230.0 lb

## 2021-06-27 DIAGNOSIS — K645 Perianal venous thrombosis: Secondary | ICD-10-CM

## 2021-06-27 DIAGNOSIS — Z09 Encounter for follow-up examination after completed treatment for conditions other than malignant neoplasm: Secondary | ICD-10-CM

## 2021-06-27 NOTE — Patient Instructions (Addendum)
Please call with any questions or concerns. Use the Anusol cream for at least one more week. Continue to do the sitz baths. Be sure to clean yourself well after each bowel movement.

## 2021-06-27 NOTE — Progress Notes (Signed)
06/27/2021  HPI: Jesus Kent is a 60 y.o. male s/p I&D of thrombosed hemorrhoid on 06/23/21.  He presents today for follow up.  Reports that the pain is now resolved.  He's still having some oozing from the area but no significant bleeding.  He did stop his Aspirin temporarily.  Has been using the Anusol, Lidocaine, and sitz baths.  Vital signs: BP 115/75   Pulse 76   Temp 98 F (36.7 C) (Oral)   Ht 6' (1.829 m)   Wt 230 lb (104.3 kg)   SpO2 95%   BMI 31.19 kg/m    Physical Exam: Constitutional: No acute distress Rectal:  External exam reveals improved swelling of the left lateral hemorrhoid with wound healing and some dark blood oozing.  Minimal clot burden left.  No erythema or induration.  Assessment/Plan: This is a 60 y.o. male s/p I&D of thrombosed left lateral hemorrhoid.  --Patient is healing well, with improved pain and resolving symptoms.  Discussed with him to use the Anusol for another week to help with the inflammatory process, continue Sitz baths to soothe the tissue.   --Discussed with him that after this heals, the enlarged skin may not fully regress, leaving a skin tag behind.  This may or may not aggravate him, and could potentially cause itchiness due to hygiene issues with stool.  If it bothers him, we can excise it in the future. --Follow up in 3 weeks to assess healing/progress.   Melvyn Neth, [facility]

## 2021-07-01 DIAGNOSIS — I480 Paroxysmal atrial fibrillation: Secondary | ICD-10-CM | POA: Diagnosis not present

## 2021-07-01 DIAGNOSIS — Z8719 Personal history of other diseases of the digestive system: Secondary | ICD-10-CM | POA: Diagnosis not present

## 2021-07-01 DIAGNOSIS — Z86010 Personal history of colonic polyps: Secondary | ICD-10-CM | POA: Diagnosis not present

## 2021-07-01 DIAGNOSIS — R1084 Generalized abdominal pain: Secondary | ICD-10-CM | POA: Diagnosis not present

## 2021-07-03 DIAGNOSIS — M13822 Other specified arthritis, left elbow: Secondary | ICD-10-CM | POA: Diagnosis not present

## 2021-07-03 DIAGNOSIS — M25522 Pain in left elbow: Secondary | ICD-10-CM | POA: Diagnosis not present

## 2021-07-04 DIAGNOSIS — M19022 Primary osteoarthritis, left elbow: Secondary | ICD-10-CM | POA: Insufficient documentation

## 2021-07-07 ENCOUNTER — Other Ambulatory Visit: Payer: Self-pay | Admitting: Gastroenterology

## 2021-07-07 ENCOUNTER — Other Ambulatory Visit (HOSPITAL_COMMUNITY): Payer: Self-pay | Admitting: Gastroenterology

## 2021-07-07 DIAGNOSIS — R1032 Left lower quadrant pain: Secondary | ICD-10-CM

## 2021-07-07 DIAGNOSIS — Z8719 Personal history of other diseases of the digestive system: Secondary | ICD-10-CM

## 2021-07-07 DIAGNOSIS — R1084 Generalized abdominal pain: Secondary | ICD-10-CM

## 2021-07-09 ENCOUNTER — Encounter: Payer: Self-pay | Admitting: Cardiovascular Disease

## 2021-07-09 ENCOUNTER — Other Ambulatory Visit: Payer: Self-pay

## 2021-07-09 ENCOUNTER — Ambulatory Visit (INDEPENDENT_AMBULATORY_CARE_PROVIDER_SITE_OTHER): Payer: 59 | Admitting: Cardiovascular Disease

## 2021-07-09 VITALS — BP 120/72 | HR 61 | Ht 72.0 in | Wt 231.2 lb

## 2021-07-09 DIAGNOSIS — I493 Ventricular premature depolarization: Secondary | ICD-10-CM

## 2021-07-09 DIAGNOSIS — R002 Palpitations: Secondary | ICD-10-CM

## 2021-07-09 DIAGNOSIS — I480 Paroxysmal atrial fibrillation: Secondary | ICD-10-CM | POA: Diagnosis not present

## 2021-07-09 DIAGNOSIS — E782 Mixed hyperlipidemia: Secondary | ICD-10-CM

## 2021-07-09 MED ORDER — DILTIAZEM HCL ER COATED BEADS 180 MG PO CP24: ORAL_CAPSULE | ORAL | 3 refills | Status: DC

## 2021-07-09 MED ORDER — METOPROLOL TARTRATE 25 MG PO TABS
25.0000 mg | ORAL_TABLET | Freq: Two times a day (BID) | ORAL | 3 refills | Status: DC | PRN
Start: 2021-07-09 — End: 2024-01-25

## 2021-07-09 MED ORDER — ATORVASTATIN CALCIUM 10 MG PO TABS: 10.0000 mg | ORAL_TABLET | Freq: Every day | ORAL | 3 refills | Status: DC

## 2021-07-09 NOTE — Patient Instructions (Addendum)
[place] [phone] Assistance with finding a primary care provider within your area  [place] may call around to different offices and ask if they are accepting new patients. Try calling: [facility] [phone] [employer] at [place] [employer] at [place] [facility] [place] [phone]   Medication Instructions:  Metoprolol  as needed for PVC If needed , we may need flecainide as needed  If you need a refill on your cardiac medications before your next appointment, please call your pharmacy.    Lab work: No new labs needed  Testing/Procedures: No new testing needed  Follow-Up: At [facility], you and your health needs are our priority.  As part of our continuing mission to provide you with exceptional heart care, we have created designated Provider Care Teams.  These Care Teams include your primary Cardiologist (physician) and Advanced Practice Providers (APPs -  Physician Assistants and Nurse Practitioners) who all work together to provide you with the care you need, when you need it.  You will need a follow up appointment as needed  Providers on your designated Care Team:   Murray Hodgkins, NP Christell Faith, PA-C Marrianne Mood, PA-C Cadence [place], [place]  COVID-19 Vaccine Information can be found at: [email or web address] For questions related to vaccine distribution or appointments, please email [email or web address] or call [phone].

## 2021-07-09 NOTE — Progress Notes (Signed)
Cardiology Office Note  Date:  07/09/2021   ID:  [name], DOB 05/15/1961, MRN [id number]  PCP:  Patient, No Pcp Per (Inactive)   Cc: symptomatic palpitations, PVC   HPI:  [name] is a 60 year old gentleman with past medical history of Paroxysmal atrial fib, on diltiazem, lone episode 2019, some physical stress at the time Left arm pain Hyperlipidemia Bradycardia Right hip replacement, age 50, 56 (football, heavy weights) Previously followed by [facility] cardiology Last seen 10/2020 Who presents for follow-up of his symptomatic PVCs   Seen in ER 06/11/2021 for symptomatic palpitations, developed that evening and persisted until 6 AM Work-up in the emergency room reviewed showing PVCs on EKG Lab work normal Symptoms resolved on their own without intervention he was discharged home  Has rare episodes but when he does have episodes he is symptomatic Has been compliant with the diltiazem Sometimes has caffeine through ice tea in restaurants but tries to watch his caffeine at home  Retired Engineer, structural, active, healthy, works out  Denies tachycardia concerning for atrial fibrillation but recent trip to the emergency room to confirm  EKG personally reviewed by myself on todays visit Shows normal sinus rhythm rate 61 bpm no significant ST-T wave changes  Other past medical history reviewed Lone atrial fib 2019 Went to ER, started on medication for rate control, converted to normal sinus rhythm At echocardiogram stress test, both essentially normal  Echo 01/2018 NORMAL LEFT VENTRICULAR SYSTOLIC FUNCTION WITH AN ESTIMATED EF = 55 % NORMAL RIGHT VENTRICULAR SYSTOLIC FUNCTION MILD TRICUSPID AND MITRAL VALVE INSUFFICIENCY NO VALVULAR STENOSIS MILD BIATRIAL ENLARGEMENT  No other episodes of atrial fibrillation apart from 2019  CT scan chest, CT scan abdomen pelvis, no significant aortic atherosclerosis no coronary calcification   PMH:   has a past medical history  of Atrial fibrillation ([place]), Herpes simplex type 2 infection, and Hyperlipidemia.  PSH:    Past Surgical History:  Procedure Laterality Date   COLONOSCOPY WITH PROPOFOL N/A 05/26/2016   Procedure: COLONOSCOPY WITH PROPOFOL;  Surgeon: [name], MD;  Location[facility] ENDOSCOPY;  Service: Endoscopy;  Laterality: N/A;   JOINT REPLACEMENT     knee surgery x3 Right    TOTAL HIP ARTHROPLASTY      Current Outpatient Medications  Medication Sig Dispense Refill   aspirin 81 MG EC tablet Take by mouth.     atorvastatin (LIPITOR) 10 MG tablet Take 1 tablet (10 mg total) by mouth daily. 90 tablet 3   Cholecalciferol 25 MCG (1000 UT) tablet Take 1,000 Units by mouth daily.     co-enzyme Q-10 30 MG capsule Take 50 mg by mouth daily.     diltiazem (CARDIZEM CD) 180 MG 24 hr capsule TAKE 1 CAPSULE BY MOUTH EVERY DAY 90 capsule 1   hydrocortisone (ANUSOL-HC) 2.5 % rectal cream Place 1 application rectally 2 (two) times daily. 30 g 0   lidocaine (XYLOCAINE) 5 % ointment Apply 1 application topically as needed. 35.44 g 0   magnesium oxide (MAG-OX) 400 MG tablet Take 400 mg by mouth daily.     metoprolol tartrate (LOPRESSOR) 25 MG tablet TAKE 1 TABLET BY MOUTH TWICE A DAY AS NEEDED 180 tablet 1   MULTIPLE VITAMINS PO Take 1 tablet by mouth daily.     Omega-3 Fatty Acids (FISH OIL) 1000 MG CAPS Take by mouth.     Zinc Sulfate (ZINC 15 PO) Take by mouth.     No current facility-administered medications for this visit.  Allergies:   Patient has no known allergies.   Social History:  The patient  reports that he has never smoked. He has quit using smokeless tobacco. He reports that he does not drink alcohol and does not use drugs.   Family History:   family history includes Heart disease in his father; Hyperlipidemia in his mother.    Review of Systems: Review of Systems  Constitutional: Negative.   HENT: Negative.    Respiratory: Negative.    Cardiovascular: Negative.    Gastrointestinal: Negative.   Musculoskeletal: Negative.   Neurological: Negative.   Psychiatric/Behavioral: Negative.    All other systems reviewed and are negative.  PHYSICAL EXAM: VS:  BP 120/72 (BP Location: Left Arm, Patient Position: Sitting, Cuff Size: Normal)   Pulse 61   Ht 6' (1.829 m)   Wt 231 lb 3.2 oz (104.9 kg)   SpO2 97%   BMI 31.36 kg/m  , BMI Body mass index is 31.36 kg/m. Constitutional:  oriented to person, place, and time. No distress.  HENT:  Head: Grossly normal Eyes:  no discharge. No scleral icterus.  Neck: No JVD, no carotid bruits  Cardiovascular: Regular rate and rhythm, no murmurs appreciated Pulmonary/Chest: Clear to auscultation bilaterally, no wheezes or rails Abdominal: Soft.  no distension.  no tenderness.  Musculoskeletal: Normal range of motion Neurological:  normal muscle tone. Coordination normal. No atrophy Skin: Skin warm and dry Psychiatric: normal affect, pleasant  Recent Labs: 02/21/2021: ALT 19; TSH 3.590 06/11/2021: BUN 19; Creatinine, Ser 1.06; Hemoglobin 16.0; Magnesium 2.2; Platelets 193; Potassium 4.0; Sodium 139    Lipid Panel Lab Results  Component Value Date   CHOL 142 02/21/2021   HDL 52 02/21/2021   LDLCALC 76 02/21/2021   TRIG 68 02/21/2021      Wt Readings from Last 3 Encounters:  07/09/21 231 lb 3.2 oz (104.9 kg)  06/27/21 230 lb (104.3 kg)  06/23/21 227 lb 6.4 oz (103.1 kg)     ASSESSMENT AND PLAN:  Problem List Items Addressed This Visit       Cardiology Problems   PAF (paroxysmal atrial fibrillation) ([place]) - Primary   Relevant Orders   EKG 12-Lead   Hyperlipidemia   Relevant Orders   EKG 12-Lead   Other Visit Diagnoses     Palpitations       PVC (premature ventricular contraction)         Paroxysmal atrial fibrillation [place] fib 2019, low CHADS VASC, not on anticoagulation On diltiazem, will continue current dose, metoprolol as needed  Premature ventricular contractions,  symptomatic Recent episode September 2022, went to the emergency room, PVCs documented on EKG Recommend he take metoprolol tartrate as needed for symptomatic palpitations May need repeat dose 1 or 2 hours later if symptoms persist If he has frequent episodes not relieved with metoprolol, may need to add flecainide to his metoprolol  Hyperlipidemia Cholesterol is at goal on the current lipid regimen. No changes to the medications were made.     Total encounter time more than 25 minutes  Greater than 50% was spent in counseling and coordination of care with the patient    Signed, Esmond Plants, M.D., Ph.D. [place], [place]

## 2021-07-14 ENCOUNTER — Encounter: Payer: Self-pay | Admitting: Physician Assistant

## 2021-07-14 ENCOUNTER — Ambulatory Visit: Payer: Self-pay | Admitting: Physician Assistant

## 2021-07-14 ENCOUNTER — Other Ambulatory Visit: Payer: Self-pay

## 2021-07-14 VITALS — BP 108/79 | HR 94 | Temp 98.4°F | Resp 14 | Ht 72.0 in | Wt 223.0 lb

## 2021-07-14 DIAGNOSIS — Z23 Encounter for immunization: Secondary | ICD-10-CM

## 2021-07-14 DIAGNOSIS — R1031 Right lower quadrant pain: Secondary | ICD-10-CM

## 2021-07-14 NOTE — Progress Notes (Signed)
   Subjective: Right lower quadrant abdominal pain    Patient ID: Jesus Kent, male    DOB: 1961-05-22, 60 y.o.   MRN: [id number]  HPI Patient complain of right abdominal pain secondary to overuse of her bowels in the gym.  Patient went to the surgical clinic and was found to have a thrombosed hemorrhoid.  Surgical correction was done at the clinic.  Patient states he is having increased right lower abdominal pain.  Patient did pain increased with sitting or squatting.  Denies any blood in stool.  Was concerned for hernia.   Review of Systems Hyperlipidemia and hypertension.    Objective:   Physical Exam No acute distress.  Temperature 98.4, pulse 94, respiration 14, BP is 108/79, patient 96% O2 sat on room air.  Patient weighs 223 pounds and BMI is 30.24.  Examination abdomen shows mild distention secondary to body habitus.  No palpable lesion.  Moderate guarding palpation of the right mid quadrant.  No rebound.       Assessment & Plan: Abdominal strain versus hernia.   Patient was scheduled for CT of the abdomen which would be around the end of the month.  Advised patient we will do an ultrasound to see if there are any acute findings.  Patient will follow after ultrasound.

## 2021-07-15 ENCOUNTER — Other Ambulatory Visit: Payer: Self-pay | Admitting: Cardiovascular Disease

## 2021-07-17 ENCOUNTER — Ambulatory Visit: Payer: 59

## 2021-07-17 ENCOUNTER — Other Ambulatory Visit: Payer: Self-pay

## 2021-07-17 ENCOUNTER — Ambulatory Visit (HOSPITAL_COMMUNITY)
Admission: RE | Admit: 2021-07-17 | Discharge: 2021-07-17 | Disposition: A | Discharge status: Home / Self Care | Payer: 59 | Source: Ambulatory Visit | Attending: Physician Assistant | Admitting: Physician Assistant

## 2021-07-17 DIAGNOSIS — R1031 Right lower quadrant pain: Secondary | ICD-10-CM | POA: Insufficient documentation

## 2021-07-18 ENCOUNTER — Encounter: Payer: Self-pay | Admitting: Surgery

## 2021-07-18 ENCOUNTER — Ambulatory Visit (INDEPENDENT_AMBULATORY_CARE_PROVIDER_SITE_OTHER): Payer: 59 | Admitting: Surgery

## 2021-07-18 VITALS — BP 130/80 | HR 84 | Temp 98.3°F | Ht 72.0 in | Wt 229.2 lb

## 2021-07-18 DIAGNOSIS — K645 Perianal venous thrombosis: Secondary | ICD-10-CM | POA: Diagnosis not present

## 2021-07-18 DIAGNOSIS — Z09 Encounter for follow-up examination after completed treatment for conditions other than malignant neoplasm: Secondary | ICD-10-CM

## 2021-07-18 DIAGNOSIS — R1031 Right lower quadrant pain: Secondary | ICD-10-CM | POA: Diagnosis not present

## 2021-07-18 NOTE — Progress Notes (Signed)
07/18/2021  HPI: Jesus Kent is a 60 y.o. male s/p I&D of thrombosed external hemorrhoid on 06/23/2021.  He presents today for follow-up.  Reports that there is no further drainage, bleeding, pain, or bulging sensation.  His bowel movements are soft and normal.  Of note, he does report that he had an ultrasound yesterday of his right lower quadrant because of his concern for hernia.  He has been having discomfort in the right lower quadrant since around the same time as the thrombosed hemorrhoid occurred while he was doing a heavy workout at the gym.  His ultrasound was done yesterday but has not been read yet.  Of note he also is scheduled for a CT scan of the abdomen and pelvis with contrast on 07/28/2021.  Vital signs: BP 130/80   Pulse 84   Temp 98.3 F (36.8 C) (Oral)   Ht 6' (1.829 m)   Wt 229 lb 3.2 oz (104 kg)   SpO2 96%   BMI 31.09 kg/m    Physical Exam: Constitutional: No acute distress Abdomen: Soft, nondistended, does not appear to be tender to palpation.  When straining or coughing, there is an area of weakness in the right lower quadrant towards the groin area but I do not discern a true hernia defect or anything bulging through. Rectal: I&D site of the external hemorrhoid has fully healed and returned to normal size.  There is no swelling, induration, or tenderness.  Assessment/Plan: This is a 60 y.o. male s/p I&D of a thrombosed external hemorrhoid.  -Patient has fully headled from his thrombosed external hemorrhoid.  There is no real residual skin tag at this point. - I do not feel a true hernia defect but we will await the results of the ultrasound.  Patient also has a CT scan pending in 10 days and this may show better resolution of any potential hernias.  Discussed with him that if there is any hernias on either imaging study, I will be glad to see him and discuss any further options for him to repair. - Otherwise from a hemorrhoid standpoint, he may follow-up as  needed.  Recommend that he continue bowel regimen to keep the stool soft without any straining.   Jesus Kent, [facility]

## 2021-07-18 NOTE — Patient Instructions (Addendum)
If you have any concerns or questions, please feel free to call our office. Follow up as needed.   If you have a hernia, contact your primary care and have them send [place] a referral and we will call you for an appointment.

## 2021-07-28 ENCOUNTER — Ambulatory Visit: Payer: 59

## 2021-07-28 ENCOUNTER — Other Ambulatory Visit: Payer: Self-pay

## 2021-07-28 ENCOUNTER — Ambulatory Visit
Admission: RE | Admit: 2021-07-28 | Discharge: 2021-07-28 | Disposition: A | Discharge status: Home / Self Care | Payer: 59 | Source: Ambulatory Visit | Attending: Gastroenterology | Admitting: Gastroenterology

## 2021-07-28 DIAGNOSIS — R109 Unspecified abdominal pain: Secondary | ICD-10-CM | POA: Diagnosis not present

## 2021-07-28 DIAGNOSIS — Z8719 Personal history of other diseases of the digestive system: Secondary | ICD-10-CM | POA: Diagnosis not present

## 2021-07-28 DIAGNOSIS — R1084 Generalized abdominal pain: Secondary | ICD-10-CM | POA: Insufficient documentation

## 2021-07-28 DIAGNOSIS — R1032 Left lower quadrant pain: Secondary | ICD-10-CM | POA: Diagnosis not present

## 2021-07-28 MED ORDER — IOHEXOL 350 MG/ML SOLN
85.0000 mL | Freq: Once | INTRAVENOUS | Status: AC | PRN
  Administered 2021-07-28: 85 mL via INTRAVENOUS

## 2021-08-04 DIAGNOSIS — D485 Neoplasm of uncertain behavior of skin: Secondary | ICD-10-CM | POA: Diagnosis not present

## 2021-08-04 DIAGNOSIS — L570 Actinic keratosis: Secondary | ICD-10-CM | POA: Diagnosis not present

## 2021-08-04 DIAGNOSIS — K5730 Diverticulosis of large intestine without perforation or abscess without bleeding: Secondary | ICD-10-CM | POA: Diagnosis not present

## 2021-08-04 DIAGNOSIS — D2271 Melanocytic nevi of right lower limb, including hip: Secondary | ICD-10-CM | POA: Diagnosis not present

## 2021-08-04 DIAGNOSIS — X32XXXA Exposure to sunlight, initial encounter: Secondary | ICD-10-CM | POA: Diagnosis not present

## 2021-08-04 DIAGNOSIS — Z85828 Personal history of other malignant neoplasm of skin: Secondary | ICD-10-CM | POA: Diagnosis not present

## 2021-08-04 DIAGNOSIS — D1801 Hemangioma of skin and subcutaneous tissue: Secondary | ICD-10-CM | POA: Diagnosis not present

## 2021-08-04 DIAGNOSIS — K640 First degree hemorrhoids: Secondary | ICD-10-CM | POA: Diagnosis not present

## 2021-08-04 DIAGNOSIS — D2261 Melanocytic nevi of right upper limb, including shoulder: Secondary | ICD-10-CM | POA: Diagnosis not present

## 2021-08-04 DIAGNOSIS — D2262 Melanocytic nevi of left upper limb, including shoulder: Secondary | ICD-10-CM | POA: Diagnosis not present

## 2021-08-04 DIAGNOSIS — Z86010 Personal history of colonic polyps: Secondary | ICD-10-CM | POA: Diagnosis not present

## 2021-08-11 ENCOUNTER — Other Ambulatory Visit: Payer: Self-pay

## 2021-08-11 DIAGNOSIS — R1032 Left lower quadrant pain: Secondary | ICD-10-CM

## 2021-08-11 MED ORDER — CIPROFLOXACIN HCL 500 MG PO TABS: 500.0000 mg | ORAL_TABLET | Freq: Two times a day (BID) | ORAL | 0 refills | Status: DC

## 2021-08-11 MED ORDER — METRONIDAZOLE 500 MG PO TABS: 500.0000 mg | ORAL_TABLET | Freq: Two times a day (BID) | ORAL | 0 refills | Status: DC

## 2021-08-26 DIAGNOSIS — M25561 Pain in right knee: Secondary | ICD-10-CM | POA: Diagnosis not present

## 2021-08-26 DIAGNOSIS — M1711 Unilateral primary osteoarthritis, right knee: Secondary | ICD-10-CM | POA: Insufficient documentation

## 2021-08-26 DIAGNOSIS — M13861 Other specified arthritis, right knee: Secondary | ICD-10-CM | POA: Diagnosis not present

## 2021-09-10 ENCOUNTER — Other Ambulatory Visit: Payer: Self-pay

## 2021-09-10 DIAGNOSIS — Z8619 Personal history of other infectious and parasitic diseases: Secondary | ICD-10-CM

## 2021-09-11 MED ORDER — VALACYCLOVIR HCL 500 MG PO TABS: 500.0000 mg | ORAL_TABLET | Freq: Two times a day (BID) | ORAL | 1 refills | Status: DC

## 2021-09-22 ENCOUNTER — Other Ambulatory Visit: Payer: 59

## 2021-09-22 ENCOUNTER — Encounter: Payer: Self-pay | Admitting: Physician Assistant

## 2021-09-22 ENCOUNTER — Ambulatory Visit: Payer: 59 | Admitting: Physician Assistant

## 2021-09-22 ENCOUNTER — Other Ambulatory Visit: Payer: Self-pay

## 2021-09-22 DIAGNOSIS — Z1152 Encounter for screening for COVID-19: Secondary | ICD-10-CM

## 2021-09-22 DIAGNOSIS — J0120 Acute ethmoidal sinusitis, unspecified: Secondary | ICD-10-CM

## 2021-09-22 LAB — POCT INFLUENZA A/B
Influenza A, POC: NEGATIVE
Influenza B, POC: NEGATIVE

## 2021-09-22 LAB — POC COVID19 BINAXNOW: SARS Coronavirus 2 Ag: NEGATIVE

## 2021-09-22 MED ORDER — FEXOFENADINE-PSEUDOEPHED ER 60-120 MG PO TB12: 1.0000 | ORAL_TABLET | Freq: Two times a day (BID) | ORAL | 0 refills | Status: DC

## 2021-09-22 MED ORDER — AMOXICILLIN 875 MG PO TABS: 875.0000 mg | ORAL_TABLET | Freq: Two times a day (BID) | ORAL | 0 refills | Status: AC

## 2021-09-22 NOTE — Progress Notes (Signed)
° °  Subjective: Sinus congestion    Patient ID: Jesus Kent, male    DOB: May 30, 1961, 60 y.o.   MRN: [id number]  HPI Patient presents with greater than 1 week of sinus congestion and ear pressure/pain.  Denies fever/chills.  No recent travel or known contact with COVID-19.  Patient tested negative for COVID 19 and influenza today.   Review of Systems Seasonal rhinitis and hyperlipidemia    Objective:   Physical Exam There is a virtual visit.       Assessment & Plan: Sinusitis   Patient given prescription for Allegra-D and amoxicillin.  Patient advised follow-up 1 week if no improvement or worsening complaints.

## 2021-09-22 NOTE — Progress Notes (Signed)
Pt has been sick over week 09/17/21 Cough,green mucous, no fever,sinus pressure above nose. Pt states he's taken 3 home test that are negative./CL,RMA

## 2021-09-22 NOTE — Progress Notes (Signed)
Pt believes he has sinus infection. Pt symptoms started 09/17/21. Cough, green mucous, no fever, and  sinus pressure around bridge of nose.  RAPID FLU: Negative RAPID COVID: negative  PCR COVID PENDING>

## 2021-09-24 LAB — NOVEL CORONAVIRUS, NAA: SARS-CoV-2, NAA: NOT DETECTED

## 2021-09-24 LAB — SARS-COV-2, NAA 2 DAY TAT

## 2021-11-05 DIAGNOSIS — B360 Pityriasis versicolor: Secondary | ICD-10-CM | POA: Diagnosis not present

## 2021-11-07 ENCOUNTER — Telehealth: Payer: Self-pay

## 2021-11-07 ENCOUNTER — Other Ambulatory Visit: Payer: Self-pay | Admitting: Physician Assistant

## 2021-11-07 DIAGNOSIS — R1032 Left lower quadrant pain: Secondary | ICD-10-CM

## 2021-11-07 MED ORDER — METRONIDAZOLE 500 MG PO TABS: 500.0000 mg | ORAL_TABLET | Freq: Two times a day (BID) | ORAL | 0 refills | Status: DC

## 2021-11-07 MED ORDER — CIPROFLOXACIN HCL 500 MG PO TABS: 500.0000 mg | ORAL_TABLET | Freq: Two times a day (BID) | ORAL | 0 refills | Status: DC

## 2021-11-07 MED ORDER — DICYCLOMINE HCL 20 MG PO TABS: 20.0000 mg | ORAL_TABLET | Freq: Three times a day (TID) | ORAL | 0 refills | Status: DC

## 2021-11-07 NOTE — Telephone Encounter (Signed)
Merry Proud notified of Rx awaiting at pharmacy, and follow up appt scheduled with provider on Monday Feb 6th at [place] verbalized good understanding to seek emergent medical care if pain or symptoms should progress over the weekend.

## 2021-11-07 NOTE — Telephone Encounter (Signed)
Jesus Kent called into report abdominal pain, fever and chills believed to be flare up of diverticulitis.  He is requesting medications be called into pharmacy.

## 2021-11-08 DIAGNOSIS — Z87891 Personal history of nicotine dependence: Secondary | ICD-10-CM | POA: Diagnosis not present

## 2021-11-08 DIAGNOSIS — R1032 Left lower quadrant pain: Secondary | ICD-10-CM | POA: Diagnosis not present

## 2021-11-08 DIAGNOSIS — Z6831 Body mass index (BMI) 31.0-31.9, adult: Secondary | ICD-10-CM | POA: Diagnosis not present

## 2021-11-08 DIAGNOSIS — R509 Fever, unspecified: Secondary | ICD-10-CM | POA: Diagnosis not present

## 2021-11-08 DIAGNOSIS — E785 Hyperlipidemia, unspecified: Secondary | ICD-10-CM | POA: Diagnosis not present

## 2021-11-08 DIAGNOSIS — I4891 Unspecified atrial fibrillation: Secondary | ICD-10-CM | POA: Diagnosis not present

## 2021-11-08 DIAGNOSIS — K5792 Diverticulitis of intestine, part unspecified, without perforation or abscess without bleeding: Secondary | ICD-10-CM | POA: Diagnosis not present

## 2021-11-08 DIAGNOSIS — Z79899 Other long term (current) drug therapy: Secondary | ICD-10-CM | POA: Diagnosis not present

## 2021-11-08 DIAGNOSIS — K5732 Diverticulitis of large intestine without perforation or abscess without bleeding: Secondary | ICD-10-CM | POA: Diagnosis not present

## 2021-11-09 DIAGNOSIS — K5792 Diverticulitis of intestine, part unspecified, without perforation or abscess without bleeding: Secondary | ICD-10-CM | POA: Diagnosis not present

## 2021-11-10 ENCOUNTER — Other Ambulatory Visit: Payer: Self-pay

## 2021-11-10 ENCOUNTER — Ambulatory Visit: Payer: Self-pay | Admitting: Physician Assistant

## 2021-11-10 ENCOUNTER — Encounter: Payer: Self-pay | Admitting: Physician Assistant

## 2021-11-10 VITALS — BP 113/86 | HR 108 | Temp 98.7°F | Resp 14 | Ht 72.0 in | Wt 229.0 lb

## 2021-11-10 DIAGNOSIS — K5732 Diverticulitis of large intestine without perforation or abscess without bleeding: Secondary | ICD-10-CM

## 2021-11-10 NOTE — Progress Notes (Signed)
° °  Subjective: Diverticulitis    Patient ID: Jesus Kent, male    DOB: 1961/03/17, 61 y.o.   MRN: [id number]  HPI Patient follow-up for flareup of diverticulitis.  Patient was started on antibiotics 3 days ago but states the pain increased 2 days ago so he went to the emergency room at [facility].  Patient states the evaluation confirmed by CT scan as a flareup of diverticulitis.  Patient also was found to have upper gastric ulceration versus tumor and was advised to have endoscopic evaluation in the near future.  Patient is that he will follow-up with a gastroenterologist clinic.  Patient states pain and discomfort has resolved but he will continue to take the antibiotics till completion.   Review of Systems Diverticulitis, GERD, and hyperlipidemia.    Objective:   Physical Exam No acute distress.  Temperature is 98.7, pulse is 108, respiration 14, BP is 113/86, the patient 94% O2 sat on room air. Abdomen with negative HSM, normoactive bowel sounds, soft, nontender to palpation.       Assessment & Plan: Diverticulitis.  Advised patient continue previous medication and follow-up with gastroenterologist clinic.

## 2021-11-10 NOTE — Progress Notes (Signed)
Pt stating he was in so much pain Saturday he went to the hospital. Pt has results he wants to discuss. This is one of the worse flare ups he has had./CL,RMA

## 2021-11-13 DIAGNOSIS — K5732 Diverticulitis of large intestine without perforation or abscess without bleeding: Secondary | ICD-10-CM | POA: Diagnosis not present

## 2021-11-13 DIAGNOSIS — R933 Abnormal findings on diagnostic imaging of other parts of digestive tract: Secondary | ICD-10-CM | POA: Diagnosis not present

## 2021-11-19 DIAGNOSIS — K449 Diaphragmatic hernia without obstruction or gangrene: Secondary | ICD-10-CM | POA: Diagnosis not present

## 2021-11-19 DIAGNOSIS — R933 Abnormal findings on diagnostic imaging of other parts of digestive tract: Secondary | ICD-10-CM | POA: Diagnosis not present

## 2021-11-19 DIAGNOSIS — K2970 Gastritis, unspecified, without bleeding: Secondary | ICD-10-CM | POA: Diagnosis not present

## 2021-11-19 DIAGNOSIS — K2960 Other gastritis without bleeding: Secondary | ICD-10-CM | POA: Diagnosis not present

## 2021-11-19 DIAGNOSIS — K2950 Unspecified chronic gastritis without bleeding: Secondary | ICD-10-CM | POA: Diagnosis not present

## 2021-11-19 DIAGNOSIS — K3189 Other diseases of stomach and duodenum: Secondary | ICD-10-CM | POA: Diagnosis not present

## 2021-11-21 ENCOUNTER — Other Ambulatory Visit: Payer: Self-pay | Admitting: Physician Assistant

## 2021-11-21 DIAGNOSIS — Z8619 Personal history of other infectious and parasitic diseases: Secondary | ICD-10-CM

## 2021-11-24 ENCOUNTER — Other Ambulatory Visit: Payer: Self-pay

## 2021-11-24 DIAGNOSIS — M25561 Pain in right knee: Secondary | ICD-10-CM | POA: Diagnosis not present

## 2021-11-24 DIAGNOSIS — M1711 Unilateral primary osteoarthritis, right knee: Secondary | ICD-10-CM | POA: Diagnosis not present

## 2021-11-24 NOTE — Telephone Encounter (Signed)
Merry Proud called stating [facility] notified that they are still waiting on a response from [place] for Rx refill for Valtrex.  When I went to refill med tab, a pended Rx refill request was in [place], sent electronically by patient's pharmacy to [facility] ED.  Re-routed the Rx refill request to Randel Pigg, PA-C.  AMD

## 2022-02-03 ENCOUNTER — Ambulatory Visit: Payer: Self-pay

## 2022-02-03 DIAGNOSIS — M1711 Unilateral primary osteoarthritis, right knee: Secondary | ICD-10-CM | POA: Diagnosis not present

## 2022-02-03 DIAGNOSIS — Z0000 Encounter for general adult medical examination without abnormal findings: Secondary | ICD-10-CM

## 2022-02-03 DIAGNOSIS — H5203 Hypermetropia, bilateral: Secondary | ICD-10-CM | POA: Diagnosis not present

## 2022-02-03 LAB — POCT URINALYSIS DIPSTICK
Bilirubin, UA: NEGATIVE
Glucose, UA: NEGATIVE
Ketones, UA: NEGATIVE
Leukocytes, UA: NEGATIVE
Nitrite, UA: NEGATIVE
Protein, UA: NEGATIVE
Spec Grav, UA: 1.025 (ref 1.010–1.025)
Urobilinogen, UA: 0.2 E.U./dL
pH, UA: 6 (ref 5.0–8.0)

## 2022-02-03 NOTE — Progress Notes (Signed)
Pt presents today for physical labs, will return to clinic for scheduled physical.  

## 2022-02-04 LAB — CMP12+LP+TP+TSH+6AC+PSA+CBC…
ALT: 22 IU/L (ref 0–44)
AST: 22 IU/L (ref 0–40)
Albumin/Globulin Ratio: 1.7 (ref 1.2–2.2)
Albumin: 4.7 g/dL (ref 3.8–4.9)
Alkaline Phosphatase: 75 IU/L (ref 44–121)
BUN/Creatinine Ratio: 16 (ref 9–20)
BUN: 16 mg/dL (ref 6–24)
Basophils Absolute: 0 10*3/uL (ref 0.0–0.2)
Basos: 1 %
Bilirubin Total: 0.6 mg/dL (ref 0.0–1.2)
Calcium: 9.5 mg/dL (ref 8.7–10.2)
Chloride: 105 mmol/L (ref 96–106)
Chol/HDL Ratio: 3.4 ratio (ref 0.0–5.0)
Cholesterol, Total: 156 mg/dL (ref 100–199)
Creatinine, Ser: 1 mg/dL (ref 0.76–1.27)
EOS (ABSOLUTE): 0.1 10*3/uL (ref 0.0–0.4)
Eos: 2 %
Estimated CHD Risk: 0.5 times avg. (ref 0.0–1.0)
Free Thyroxine Index: 2.3 (ref 1.2–4.9)
GGT: 16 IU/L (ref 0–65)
Globulin, Total: 2.7 g/dL (ref 1.5–4.5)
Glucose: 92 mg/dL (ref 70–99)
HDL: 46 mg/dL (ref >39)
Hematocrit: 47.4 % (ref 37.5–51.0)
Hemoglobin: 16.6 g/dL (ref 13.0–17.7)
Immature Grans (Abs): 0 10*3/uL (ref 0.0–0.1)
Immature Granulocytes: 0 %
Iron: 84 ug/dL (ref 38–169)
LDH: 222 IU/L (ref 121–224)
LDL Chol Calc (NIH): 89 mg/dL (ref 0–99)
Lymphocytes Absolute: 2.4 10*3/uL (ref 0.7–3.1)
Lymphs: 37 %
MCH: 30.8 pg (ref 26.6–33.0)
MCHC: 35 g/dL (ref 31.5–35.7)
MCV: 88 fL (ref 79–97)
Monocytes Absolute: 0.6 10*3/uL (ref 0.1–0.9)
Monocytes: 9 %
Neutrophils Absolute: 3.2 10*3/uL (ref 1.4–7.0)
Neutrophils: 51 %
Phosphorus: 3.9 mg/dL (ref 2.8–4.1)
Platelets: 203 10*3/uL (ref 150–450)
Potassium: 4.2 mmol/L (ref 3.5–5.2)
Prostate Specific Ag, Serum: 0.6 ng/mL (ref 0.0–4.0)
RBC: 5.39 x10E6/uL (ref 4.14–5.80)
RDW: 12.9 % (ref 11.6–15.4)
Sodium: 142 mmol/L (ref 134–144)
T3 Uptake Ratio: 25 % (ref 24–39)
T4, Total: 9 ug/dL (ref 4.5–12.0)
TSH: 2.8 u[IU]/mL (ref 0.450–4.500)
Total Protein: 7.4 g/dL (ref 6.0–8.5)
Triglycerides: 119 mg/dL (ref 0–149)
Uric Acid: 5.7 mg/dL (ref 3.8–8.4)
VLDL Cholesterol Cal: 21 mg/dL (ref 5–40)
WBC: 6.3 10*3/uL (ref 3.4–10.8)
eGFR: 87 mL/min/{1.73_m2} (ref >59)

## 2022-02-10 ENCOUNTER — Encounter: Payer: Self-pay | Admitting: Physician Assistant

## 2022-02-10 ENCOUNTER — Ambulatory Visit: Payer: Self-pay | Admitting: Physician Assistant

## 2022-02-10 DIAGNOSIS — Z0000 Encounter for general adult medical examination without abnormal findings: Secondary | ICD-10-CM

## 2022-02-10 DIAGNOSIS — W57XXXA Bitten or stung by nonvenomous insect and other nonvenomous arthropods, initial encounter: Secondary | ICD-10-CM

## 2022-02-10 NOTE — Progress Notes (Signed)
? ?[facility] [place] [facility] ? ?____________________________________________ ? ? None  ?  (approximate) ? ?I have reviewed the triage vital signs and the nursing notes. ? ? ?HISTORY ? ?Chief Complaint ?No chief complaint on file. ? ? ? ?HPI ?Jesus Kent is a 61 y.o. male patient presents for annual physical exam.  Patient is most concerned secondary to an insect bite to the right lower leg which occurred a week ago.  Patient state he noticed erythema around the area.  Patient states incident occurred while golfing and his strong suspicion for tick bite.  Patient states no obvious observed insect on leg.  Patient denies fever/chills, or joint pain. ?   ? ?  ? ? ?Past Medical History:  ?Diagnosis Date  ? Atrial fibrillation ([place])   ? Herpes simplex type 2 infection   ? [facility]  ? Hyperlipidemia   ? ? ?Patient Active Problem List  ? Diagnosis Date Noted  ? Diverticulitis of colon 06/2008 08/13/2020  ? Seasonal rhinitis 08/13/2020  ? GERD (gastroesophageal reflux disease) 08/13/2020  ? PAF (paroxysmal atrial fibrillation) ([place]) 12/10/2019  ? Hyperlipidemia 12/10/2019  ? Bradycardia 07/20/2018  ? History of right hip replacement 05/28/2017  ? Obesity (BMI 30-39.9) 04/20/2017  ? Follow-up examination after orthopedic surgery 03/16/2017  ? Osteoarthritis of knee 09/04/2013  ? Ulnar nerve entrapment at elbow 09/04/2013  ? Heterotopic ossification of bone 04/06/2013  ? ? ?Past Surgical History:  ?Procedure Laterality Date  ? COLONOSCOPY WITH PROPOFOL N/A 05/26/2016  ? Procedure: COLONOSCOPY WITH PROPOFOL;  Surgeon: [name], MD;  Location: [facility] ENDOSCOPY;  Service: Endoscopy;  Laterality: N/A;  ? JOINT REPLACEMENT    ? knee surgery x3 Right   ? TOTAL HIP ARTHROPLASTY    ? ? ?Prior to Admission medications   ?Medication Sig Start Date End Date Taking? Authorizing Provider  ?aspirin 81 MG EC tablet Take by mouth. 05/11/17   [provider]  ?atorvastatin (LIPITOR) 10 MG  tablet Take 1 tablet (10 mg total) by mouth daily. 07/09/21   Minna Merritts, MD  ?Cholecalciferol 25 MCG (1000 UT) tablet Take 1,000 Units by mouth daily.    [provider]  ?ciprofloxacin (CIPRO) 500 MG tablet Take 1 tablet (500 mg total) by mouth 2 (two) times daily. 11/07/21   Sable Feil, PA-C  ?co-enzyme Q-10 30 MG capsule Take 50 mg by mouth daily.    [provider]  ?dicyclomine (BENTYL) 20 MG tablet Take 1 tablet (20 mg total) by mouth 4 (four) times daily -  before meals and at bedtime. 11/07/21   Sable Feil, PA-C  ?diltiazem [facility]) 120 MG 24 hr capsule Take 1 capsule by mouth daily.    [provider]  ?doxycycline (DORYX) 100 MG EC tablet Take by mouth.    [provider]  ?fexofenadine-pseudoephedrine (ALLEGRA-D) 60-120 MG 12 hr tablet Take 1 tablet by mouth 2 (two) times daily. 09/22/21   Sable Feil, PA-C  ?magnesium oxide (MAG-OX) 400 MG tablet Take 400 mg by mouth daily.    [provider]  ?meloxicam (MOBIC) 15 MG tablet 1 tablet as needed.    [provider]  ?metoprolol tartrate (LOPRESSOR) 25 MG tablet Take 1 tablet (25 mg total) by mouth 2 (two) times daily as needed. For PVCs/palpitations 07/09/21   Minna Merritts, MD  ?metroNIDAZOLE (FLAGYL) 500 MG tablet Take 1 tablet (500 mg total) by mouth 2 (two) times daily. 11/07/21   Tamala Julian,  Hinda Lenis, PA-C  ?MULTIPLE VITAMINS PO Take 1 tablet by mouth daily.    [provider]  ?Omega-3 Fatty Acids (FISH OIL) 1000 MG CAPS Take by mouth.    [provider]  ?valACYclovir (VALTREX) 500 MG tablet TAKE 1 TABLET BY MOUTH TWICE A DAY 11/24/21   Sable Feil, PA-C  ?Zinc Sulfate (ZINC 15 PO) Take by mouth.    [provider]  ? ? ?Allergies ?Patient has no known allergies. ? ?Family History  ?Problem Relation Age of Onset  ? Hyperlipidemia Mother   ? Heart disease Father   ?     stent   ? ? ?Social History ?Social History  ? ?Tobacco Use  ? Smoking status: Never   ? Smokeless tobacco: Former  ?Vaping Use  ? Vaping Use: Never used  ?Substance Use Topics  ? Alcohol use: Never  ?  Comment: rare  ? Drug use: No  ? ? ?Review of Systems ?Constitutional: No fever/chills ?Eyes: No visual changes. ?ENT: No sore throat. ?Cardiovascular: Denies chest pain. ?Respiratory: Denies shortness of breath. ?Gastrointestinal: No abdominal pain.  No nausea, no vomiting.  No diarrhea.  No constipation. ?Genitourinary: Negative for dysuria. ?Musculoskeletal: Negative for back pain. ?Skin: Negative for rash.  Insect bite right lower leg. ?Neurological: Negative for headaches, focal weakness or numbness. ? ?____________________________________________ ? ? ?PHYSICAL EXAM: ? ?VITAL SIGNS: Blood pressure 125/83, temperature 97.7, weight 240 pounds, pulse 65, patient is 97% O2 sat on room air. ?Constitutional: Alert and oriented. Well appearing and in no acute distress. ?Eyes: Conjunctivae are normal. PERRL. EOMI. ?Head: Atraumatic. ?Nose: No congestion/rhinnorhea. ?Mouth/Throat: Mucous membranes are moist.  Oropharynx non-erythematous. ?Neck: No stridor.  No cervical spine tenderness to palpation. ?Hematological/Lymphatic/Immunilogical: No cervical lymphadenopathy. ?Cardiovascular: Normal rate, regular rhythm. Grossly normal heart sounds.  Good peripheral circulation. ?Respiratory: Normal respiratory effort.  No retractions. Lungs CTAB. ?Gastrointestinal: Soft and nontender. No distention. No abdominal bruits. No CVA tenderness. ?Genitourinary: Deferred ?Musculoskeletal: No lower extremity tenderness nor edema.  No joint effusions. ?Neurologic:  Normal speech and language. No gross focal neurologic deficits are appreciated. No gait instability. ?Skin:  Skin is warm, dry and intact.  Papular lesion on erythematous base right lower leg. ?Psychiatric: Mood and affect are normal. Speech and behavior are normal. ? ?____________________________________________ ?  ?LABS ?_ ?      ?Component Ref Range & Units 7  d ago 11 mo ago 1 yr ago  ?Color, UA  dark yellow  Yellow  Yellow   ?Clarity, UA  cloudy  Clear  Clear   ?Glucose, UA Negative Negative  Negative  Negative   ?Bilirubin, UA  negative  Negative  Negative   ?Ketones, UA  negative  Negative  Negative   ?Spec Grav, UA 1.010 - 1.025 1.025  1.015  1.025   ?Blood, UA  trace  Negative  Negative   ?Comment: -+   ?pH, UA 5.0 - 8.0 6.0  7.0  6.0   ?Protein, UA Negative Negative  Negative  Negative   ?Urobilinogen, UA 0.2 or 1.0 E.U./dL 0.2  0.2  0.2   ?Nitrite, UA  negative  Negative  Negative   ?Leukocytes, UA Negative Negative  Negative  Negative   ?Appearance  scant urine       ?Odor        ?  ? ?  ?  ?Specimen Collected: 02/03/22 09:23 Last Resulted: 02/03/22 09:23  ?  ?  ?View Encounter Conversation    ?  ?  ? ?  ?  ? ?  Other Results from 02/03/2022 ? ?CMP12+LP+TP+TSH+6AC+PSA+CBC? ?Order: [id number] ?Status: Final result    ?Visible to patient: Yes (seen)    ?Next appt: 03/23/2022 at 09:00 AM in Cardiology Ida Rogue, MD)    ?Dx: Routine adult health maintenance    ?0 Result Notes ?          ?Component Ref Range & Units 7 d ago ?(02/03/22) 8 mo ago ?(06/11/21) 8 mo ago ?(06/11/21) 11 mo ago ?(02/21/21) 1 yr ago ?(07/18/20) 1 yr ago ?(07/18/20) 1 yr ago ?(03/28/20)  ?Glucose 70 - 99 mg/dL 92  97 CM   84 R  92 R   97 R   ?Uric Acid 3.8 - 8.4 mg/dL 5.7    4.6 CM    5.0 CM   ?Comment:            Therapeutic target for gout patients: <6.0  ?BUN 6 - 24 mg/dL 16  19 R   15  15   17    ?Creatinine, Ser 0.76 - 1.27 mg/dL 1.00  1.06 R   0.91  0.90   1.02   ?eGFR >59 mL/min/1.73 87    97      ?BUN/Creatinine Ratio 9 - 20 16    16  17   17    ?Sodium 134 - 144 mmol/L 142  139 R   138  139   141   ?Potassium 3.5 - 5.2 mmol/L 4.2  4.0 R   4.4  4.0   4.5   ?Chloride 96 - 106 mmol/L 105  106 R   101  103   106   ?Calcium 8.7 - 10.2 mg/dL 9.5  9.2 R   9.6  9.5   9.4   ?Phosphorus 2.8 - 4.1 mg/dL 3.9    3.3    4.1   ?Total Protein 6.0 - 8.5 g/dL 7.4    7.4    7.1   ?Albumin 3.8 - 4.9 g/dL 4.7     4.6    4.5   ?Globulin, Total 1.5 - 4.5 g/dL 2.7    2.8    2.6   ?Albumin/Globulin Ratio 1.2 - 2.2 1.7    1.6    1.7   ?Bilirubin Total 0.0 - 1.2 mg/dL 0.6    0.7    0.3   ?Alkaline Phosphatase 44 - 121 IU/

## 2022-02-11 ENCOUNTER — Other Ambulatory Visit: Payer: Self-pay | Admitting: Physician Assistant

## 2022-02-11 MED ORDER — DOXYCYCLINE MONOHYDRATE 100 MG PO CAPS: 100.0000 mg | ORAL_CAPSULE | Freq: Two times a day (BID) | ORAL | 0 refills | Status: DC

## 2022-02-16 DIAGNOSIS — L538 Other specified erythematous conditions: Secondary | ICD-10-CM | POA: Diagnosis not present

## 2022-02-16 DIAGNOSIS — L820 Inflamed seborrheic keratosis: Secondary | ICD-10-CM | POA: Diagnosis not present

## 2022-03-17 ENCOUNTER — Telehealth: Payer: Self-pay | Admitting: Cardiovascular Disease

## 2022-03-17 NOTE — Telephone Encounter (Signed)
Lmov to discuss if needed

## 2022-03-17 NOTE — Telephone Encounter (Signed)
-----   Message from Britt Bottom, [place] sent at 03/17/2022  3:53 PM EDT ----- Pt has upcoming appointment. Pt notes say f/u 12 month f/u. Pt last seen 07/2021 told to f/u prn. Please advise if ok to keep appointment.

## 2022-03-18 NOTE — Telephone Encounter (Signed)
Pt called and cancelled appt that was scheduled for 6/19. Pt received message and states that he will just wait until his yearly f/u unless an issue comes up. Please advise

## 2022-03-18 NOTE — Telephone Encounter (Signed)
Spoke with patient and confirmed he is scheduled for October. Patient reports at his last visit he was told to follow up yearly. Patient stated that if he should have any problems or concerns he would give [place] a call back. No further needs at this time.

## 2022-03-23 ENCOUNTER — Ambulatory Visit: Payer: 59 | Admitting: Cardiovascular Disease

## 2022-03-23 DIAGNOSIS — M1711 Unilateral primary osteoarthritis, right knee: Secondary | ICD-10-CM | POA: Diagnosis not present

## 2022-03-31 DIAGNOSIS — M1711 Unilateral primary osteoarthritis, right knee: Secondary | ICD-10-CM | POA: Diagnosis not present

## 2022-04-11 ENCOUNTER — Ambulatory Visit
Admission: EM | Admit: 2022-04-11 | Discharge: 2022-04-11 | Disposition: A | Discharge status: Home / Self Care | Payer: 59

## 2022-04-11 ENCOUNTER — Encounter: Payer: Self-pay | Admitting: Emergency Medicine

## 2022-04-11 DIAGNOSIS — K5732 Diverticulitis of large intestine without perforation or abscess without bleeding: Secondary | ICD-10-CM | POA: Diagnosis not present

## 2022-04-11 MED ORDER — METRONIDAZOLE 500 MG PO TABS: 500.0000 mg | ORAL_TABLET | Freq: Two times a day (BID) | ORAL | 0 refills | Status: DC

## 2022-04-11 MED ORDER — CIPROFLOXACIN HCL 500 MG PO TABS: 500.0000 mg | ORAL_TABLET | Freq: Two times a day (BID) | ORAL | 0 refills | Status: DC

## 2022-04-11 NOTE — ED Triage Notes (Signed)
Pt presents with left lower abdominal pain. He has a history of diverticulitis and it feels like a flare-up. He denies any other symptoms.

## 2022-04-11 NOTE — Discharge Instructions (Addendum)
Treating you for suspected diverticulitis.  If any of your symptoms worsen or you experience any bloody stools or projectile vomiting go immediately to the nearest emergency department or follow-up with your primary care provider as you will need advanced imaging with a CT scan.  Complete medication as prescribed.  Hydrate well with fluids.

## 2022-04-11 NOTE — ED Provider Notes (Signed)
Jesus Kent    CSN: [id number] Arrival date & time: 04/11/22  0909      History   Chief Complaint Chief Complaint  Patient presents with   Abdominal Pain    HPI Jesus Kent is a 61 y.o. male.   HPI Patient with a history of diverticulitis presents today with left lower quadrant pain.  Patient's last flare of diverticulitis was in February and she was treated with Cipro and Flagyl. He reports current symptoms have been present since yesterday. He endorses accidentally eating sesame seed which he suspects caused flare up. The pain is localized to the left lower quadrant and pain is not radiating.  He denies any diarrhea, nausea or vomiting.  He endorses a daily bowel movement but no watery or loose stools.  He has had no bloody stools.  His last diverticulitis flare was back in February in which she was treated with metronidazole and ciprofloxacin and achieved resolution of the symptoms.  He is afebrile.  Past Medical History:  Diagnosis Date   Atrial fibrillation ([place])    Herpes simplex type 2 infection    [facility]   Hyperlipidemia     Patient Active Problem List   Diagnosis Date Noted   Diverticulitis of colon 06/2008 08/13/2020   Seasonal rhinitis 08/13/2020   GERD (gastroesophageal reflux disease) 08/13/2020   PAF (paroxysmal atrial fibrillation) ([place]) 12/10/2019   Hyperlipidemia 12/10/2019   Bradycardia 07/20/2018   History of right hip replacement 05/28/2017   Obesity (BMI 30-39.9) 04/20/2017   Follow-up examination after orthopedic surgery 03/16/2017   Osteoarthritis of knee 09/04/2013   Ulnar nerve entrapment at elbow 09/04/2013   Heterotopic ossification of bone 04/06/2013    Past Surgical History:  Procedure Laterality Date   COLONOSCOPY WITH PROPOFOL N/A 05/26/2016   Procedure: COLONOSCOPY WITH PROPOFOL;  Surgeon: [name], MD;  Location: [facility] ENDOSCOPY;  Service: Endoscopy;  Laterality: N/A;   JOINT REPLACEMENT     knee  surgery x3 Right    TOTAL HIP ARTHROPLASTY         Home Medications    Prior to Admission medications   Medication Sig Start Date End Date Taking? Authorizing Provider  atorvastatin (LIPITOR) 10 MG tablet Take 1 tablet (10 mg total) by mouth daily. 07/09/21  Yes Minna Merritts, MD  Cholecalciferol 25 MCG (1000 UT) tablet Take 1,000 Units by mouth daily.   Yes [provider]  ciprofloxacin (CIPRO) 500 MG tablet Take 1 tablet (500 mg total) by mouth 2 (two) times daily. 04/11/22  Yes Scot Jun, FNP  co-enzyme Q-10 30 MG capsule Take 50 mg by mouth daily.   Yes [provider]  diltiazem (TIAZAC) 120 MG 24 hr capsule Take 1 capsule by mouth daily.   Yes [provider]  magnesium oxide (MAG-OX) 400 MG tablet Take 400 mg by mouth daily.   Yes [provider]  metoprolol tartrate (LOPRESSOR) 25 MG tablet Take 1 tablet (25 mg total) by mouth 2 (two) times daily as needed. For PVCs/palpitations 07/09/21  Yes Gollan, Kathlene November, MD  metroNIDAZOLE (FLAGYL) 500 MG tablet Take 1 tablet (500 mg total) by mouth 2 (two) times daily. 04/11/22  Yes Scot Jun, FNP  MULTIPLE VITAMINS PO Take 1 tablet by mouth daily.   Yes [provider]  Omega-3 Fatty Acids (FISH OIL) 1000 MG CAPS Take by mouth.   Yes [provider]  valACYclovir (VALTREX) 500 MG tablet TAKE 1 TABLET BY  MOUTH TWICE A DAY 11/24/21  Yes Sable Feil, PA-C  Zinc Sulfate (ZINC 15 PO) Take by mouth.   Yes [provider]  aspirin 81 MG EC tablet Take by mouth. 05/11/17   [provider]  atorvastatin (LIPITOR) 10 MG tablet Take 1 tablet by mouth daily.    [provider]  celecoxib (CELEBREX) 200 MG capsule Take 200 mg by mouth daily as needed. 02/25/22   [provider]  dicyclomine (BENTYL) 20 MG tablet Take 1 tablet (20 mg total) by mouth 4 (four) times daily -  before meals and at bedtime. 11/07/21   Sable Feil, PA-C   fexofenadine-pseudoephedrine (ALLEGRA-D) 60-120 MG 12 hr tablet Take 1 tablet by mouth 2 (two) times daily. 09/22/21   Sable Feil, PA-C  meloxicam (MOBIC) 15 MG tablet 1 tablet as needed.    [provider]    Family History Family History  Problem Relation Age of Onset   Hyperlipidemia Mother    Heart disease Father        stent     Social History Social History   Tobacco Use   Smoking status: Never   Smokeless tobacco: Former  Scientific laboratory technician Use: Never used  Substance Use Topics   Alcohol use: Never    Comment: rare   Drug use: No     Allergies   Patient has no known allergies.   Review of Systems Review of Systems Pertinent negatives listed in HPI   Physical Exam Triage Vital Signs ED Triage Vitals  Enc Vitals Group     BP 04/11/22 0946 121/84     Pulse Rate 04/11/22 0946 80     Resp 04/11/22 0946 18     Temp 04/11/22 0946 98.1 F (36.7 C)     Temp Source 04/11/22 0946 Oral     SpO2 04/11/22 0946 95 %     Weight --      Height --      Head Circumference --      Peak Flow --      Pain Score 04/11/22 0951 3     Pain Loc --      Pain Edu? --      Excl. in [place]? --    No data found.  Updated Vital Signs BP 121/84 (BP Location: Left Arm)   Pulse 80   Temp 98.1 F (36.7 C) (Oral)   Resp 18   SpO2 95%   Visual Acuity Right Eye Distance:   Left Eye Distance:   Bilateral Distance:    Right Eye Near:   Left Eye Near:    Bilateral Near:     Physical Exam Vitals reviewed.  Constitutional:      Appearance: He is well-developed.  Eyes:     Extraocular Movements: Extraocular movements intact.     Pupils: Pupils are equal, round, and reactive to light.  Cardiovascular:     Rate and Rhythm: Normal rate and regular rhythm.  Pulmonary:     Effort: Pulmonary effort is normal.     Breath sounds: Normal breath sounds.  Abdominal:     General: Abdomen is protuberant. Bowel sounds are normal. There is no distension.     Palpations:  Abdomen is soft.     Tenderness: There is abdominal tenderness in the suprapubic area and left lower quadrant. There is no right CVA tenderness, left CVA tenderness, guarding or rebound.  Skin:    General: Skin is warm and  dry.     Capillary Refill: Capillary refill takes less than 2 seconds.  Neurological:     General: No focal deficit present.     Mental Status: He is alert.  Psychiatric:        Mood and Affect: Mood normal.      UC Treatments / Results  Labs (all labs ordered are listed, but only abnormal results are displayed) Labs Reviewed - No data to display  EKG   Radiology No results found.  Procedures Procedures (including critical care time)  Medications Ordered in UC Medications - No data to display  Initial Impression / Assessment and Plan / UC Course  I have reviewed the triage vital signs and the nursing notes.  Pertinent labs & imaging results that were available during my care of the patient were reviewed by me and considered in my medical decision making (see chart for details).    Diverticulitis of the colon, treating with metronidazole and ciprofloxacin.  Patient advised if any red flag symptoms develop to go immediately to the ER or to contact his primary care provider as he would warrant CT imaging.  If symptoms resolve with current treatment no additional follow-up is warranted. Final Clinical Impressions(s) / UC Diagnoses   Final diagnoses:  Diverticulitis of colon     Discharge Instructions      Treating you for suspected diverticulitis.  If any of your symptoms worsen or you experience any bloody stools or projectile vomiting go immediately to the nearest emergency department or follow-up with your primary care provider as you will need advanced imaging with a CT scan.  Complete medication as prescribed.  Hydrate well with fluids.      ED Prescriptions     Medication Sig Dispense Auth. Provider   metroNIDAZOLE (FLAGYL) 500 MG tablet  Take 1 tablet (500 mg total) by mouth 2 (two) times daily. 14 tablet Scot Jun, FNP   ciprofloxacin (CIPRO) 500 MG tablet Take 1 tablet (500 mg total) by mouth 2 (two) times daily. 14 tablet Scot Jun, FNP      PDMP not reviewed this encounter.   Scot Jun, FNP 04/11/22 1054

## 2022-04-23 ENCOUNTER — Telehealth: Payer: Self-pay | Admitting: *Deleted

## 2022-04-23 NOTE — Chronic Care Management (AMB) (Signed)
  Care Coordination  Note  04/23/2022 Name: JAYDIAN SANTANA MRN: [id number] DOB: 1960/11/10  SHAMERE CAMPAS is a 61 y.o. year old male who is a primary care patient of Patient, No Pcp Per. I reached out to [name] by phone today to offer care coordination services.      Mr. Dilworth was given information about Care Coordination services today including:  The Care Coordination services include support from the care team which includes your Nurse Coordinator, Clinical Social Worker, or Pharmacist.  The Care Coordination team is here to help remove barriers to the health concerns and goals most important to you. Care Coordination services are voluntary and the patient may decline or stop services at any time by request to their care team member.   Patient agreed to services and verbal consent obtained.   Follow up plan: Telephone appointment with care coordination team member scheduled for: 04/27/2022  Julian Hy, [place] Direct Dial: [phone]

## 2022-04-27 ENCOUNTER — Ambulatory Visit: Payer: Self-pay

## 2022-04-27 NOTE — Patient Outreach (Signed)
  Care Coordination   Initial Visit Note   04/27/2022 Name: Jesus Kent MRN: [id number] DOB: 22-Feb-1961  Jesus Kent is a 61 y.o. year old male who sees Patient, No Pcp Per for primary care. I spoke with  [name] by phone today  What matters to the patients health and wellness today?  Patient states he does not have any concerns or needs at this time.  He states he is able to manage and understands his health conditions and knows to follow up with his provider/ specialist as recommended.       Goals Addressed   None     SDOH assessments and interventions completed:   Yes SDOH Interventions Today    Flowsheet Row Most Recent Value  SDOH Interventions   Food Insecurity Interventions Intervention Not Indicated  Housing Interventions Intervention Not Indicated  Transportation Interventions Intervention Not Indicated       Care Coordination Interventions Activated:  No Care Coordination Interventions:  No, not indicated  Follow up plan: No further intervention required.  Encounter Outcome:  Pt. Visit Completed  Quinn Plowman RN,BSN,CCM RN Care Manager Coordinator [place]  [phone]

## 2022-04-27 NOTE — Patient Instructions (Signed)
Visit Information  Thank you for allowing me to share the care management and care coordination services that are available to you as part of your health plan and services through your primary care provider and medical home. Please reach out to me at [phone] if the care management/care coordination team may be of assistance to you in the future.   Quinn Plowman RN,BSN,CCM RN Care Manager Coordinator [place]  [phone]

## 2022-06-02 ENCOUNTER — Other Ambulatory Visit: Payer: Self-pay | Admitting: Gastroenterology

## 2022-06-02 ENCOUNTER — Inpatient Hospital Stay: Admission: RE | Admit: 2022-06-02 | Payer: 59 | Source: Ambulatory Visit

## 2022-06-02 DIAGNOSIS — Z8719 Personal history of other diseases of the digestive system: Secondary | ICD-10-CM

## 2022-06-02 DIAGNOSIS — R1032 Left lower quadrant pain: Secondary | ICD-10-CM

## 2022-06-03 ENCOUNTER — Ambulatory Visit: Admission: RE | Admit: 2022-06-03 | Payer: 59 | Source: Ambulatory Visit

## 2022-06-03 ENCOUNTER — Encounter: Payer: Self-pay | Admitting: Cardiovascular Disease

## 2022-06-03 ENCOUNTER — Ambulatory Visit
Admission: RE | Admit: 2022-06-03 | Discharge: 2022-06-03 | Disposition: A | Discharge status: Home / Self Care | Payer: 59 | Source: Ambulatory Visit | Attending: Gastroenterology | Admitting: Gastroenterology

## 2022-06-03 DIAGNOSIS — R1032 Left lower quadrant pain: Secondary | ICD-10-CM | POA: Insufficient documentation

## 2022-06-03 DIAGNOSIS — Z8719 Personal history of other diseases of the digestive system: Secondary | ICD-10-CM | POA: Insufficient documentation

## 2022-06-03 DIAGNOSIS — K5730 Diverticulosis of large intestine without perforation or abscess without bleeding: Secondary | ICD-10-CM | POA: Diagnosis not present

## 2022-06-03 LAB — POCT I-STAT CREATININE: Creatinine, Ser: 1 mg/dL (ref 0.61–1.24)

## 2022-06-03 MED ORDER — IOHEXOL 300 MG/ML  SOLN
100.0000 mL | Freq: Once | INTRAMUSCULAR | Status: AC | PRN
  Administered 2022-06-03: 100 mL via INTRAVENOUS

## 2022-07-11 NOTE — Progress Notes (Unsigned)
Cardiology Office Note  Date:  07/13/2022   ID:  Jesus Kent, DOB 1961-04-15, MRN [id number]  PCP:  Patient, No Pcp Per   Chief Complaint  Patient presents with   Other    12 month f/u no complaints today. Meds reviewed verbally with pt.    HPI:  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, structural, No chest pain or shortness of breath on exertion Denies significant tachypalpitations Rare episodes of PVCs  No other episodes of atrial fibrillation apart from 2019  Trying to get weight down for daughter's wedding  CT abdomen images pulled up and reviewed Minimal aortic atherosclerosis on CT scan Previously seen in 2022  EKG personally reviewed by myself on todays visit Normal sinus rhythm rate 63 bpm no significant ST-T wave changes  Past medical history reviewed Seen in ER 06/11/2021 for symptomatic palpitations, developed that evening and persisted until 6 AM Work-up in the emergency room reviewed showing PVCs on EKG Lab work normal Symptoms resolved on their own   Lone atrial fib 2019 Went to ER, started on medication for rate control, converted to normal sinus rhythm echocardiogram stress test, both essentially normal  Echo 01/2018 NORMAL LEFT VENTRICULAR SYSTOLIC FUNCTION WITH AN ESTIMATED EF = 55 % NORMAL RIGHT VENTRICULAR SYSTOLIC FUNCTION MILD TRICUSPID AND MITRAL VALVE INSUFFICIENCY NO VALVULAR STENOSIS MILD BIATRIAL ENLARGEMENT  CT scan chest, CT scan abdomen pelvis, no significant aortic atherosclerosis no coronary calcification   PMH:   has a past  medical history of Atrial fibrillation ([facility] [facility]), Herpes simplex type 2 infection, and Hyperlipidemia.  PSH:    Past Surgical History:  Procedure Laterality Date   COLONOSCOPY WITH PROPOFOL N/A 05/26/2016   Procedure: COLONOSCOPY WITH PROPOFOL;  Surgeon: [name], MD;  Location: [facility] ENDOSCOPY;  Service: Endoscopy;  Laterality: N/A;   ELBOW SURGERY Left    surgery to elbow left x 2   JOINT REPLACEMENT     knee surgery x3 Right    TOTAL HIP ARTHROPLASTY      Current Outpatient Medications  Medication Sig Dispense Refill   aspirin 81 MG EC tablet Take by mouth.     atorvastatin (LIPITOR) 10 MG tablet Take 1 tablet (10 mg total) by mouth daily. 90 tablet 3   atorvastatin (LIPITOR) 10 MG tablet Take 1 tablet by mouth daily.     celecoxib (CELEBREX) 200 MG capsule Take 200 mg by mouth daily as needed.     Cholecalciferol 25 MCG (1000 UT) tablet Take 1,000 Units by mouth daily.     co-enzyme Q-10 30 MG capsule Take 50 mg by mouth daily.     diltiazem (TIAZAC) 120 MG 24 hr capsule Take 1 capsule by mouth daily.     magnesium oxide (MAG-OX) 400 MG tablet Take 400 mg by mouth daily.     metoprolol tartrate (LOPRESSOR) 25 MG tablet Take 1 tablet (25 mg total) by mouth 2 (two) times daily as needed. For PVCs/palpitations 180 tablet 3   MULTIPLE VITAMINS PO Take 1 tablet by mouth daily.     Omega-3 Fatty Acids (FISH OIL) 1000 MG CAPS Take by mouth.  Zinc Sulfate (ZINC 15 PO) Take by mouth.     No current facility-administered medications for this visit.    Allergies:   Patient has no known allergies.   Social History:  The patient  reports that he has never smoked. He has quit using smokeless tobacco. He reports that he does not drink alcohol and does not use drugs.   Family History:   family history includes Heart disease in his father; Hyperlipidemia in his mother.   Review of Systems: Review of Systems  Constitutional: Negative.   HENT: Negative.    Respiratory: Negative.     Cardiovascular: Negative.   Gastrointestinal: Negative.   Musculoskeletal: Negative.   Neurological: Negative.   Psychiatric/Behavioral: Negative.    All other systems reviewed and are negative.   PHYSICAL EXAM: VS:  BP 108/70 (BP Location: Left Arm, Patient Position: Sitting, Cuff Size: Normal)   Pulse 63   Ht 6' (1.829 m)   Wt 237 lb 2 oz (107.6 kg)   SpO2 97%   BMI 32.16 kg/m  , BMI Body mass index is 32.16 kg/m. Constitutional:  oriented to person, place, and time. No distress.  HENT:  Head: Grossly normal Eyes:  no discharge. No scleral icterus.  Neck: No JVD, no carotid bruits  Cardiovascular: Regular rate and rhythm, no murmurs appreciated Pulmonary/Chest: Clear to auscultation bilaterally, no wheezes or rails Abdominal: Soft.  no distension.  no tenderness.  Musculoskeletal: Normal range of motion Neurological:  normal muscle tone. Coordination normal. No atrophy Skin: Skin warm and dry Psychiatric: normal affect, pleasant  Recent Labs: 02/03/2022: ALT 22; BUN 16; Hemoglobin 16.6; Platelets 203; Potassium 4.2; Sodium 142; TSH 2.800 06/03/2022: Creatinine, Ser 1.00    Lipid Panel Lab Results  Component Value Date   CHOL 156 02/03/2022   HDL 46 02/03/2022   LDLCALC 89 02/03/2022   TRIG 119 02/03/2022      Wt Readings from Last 3 Encounters:  07/13/22 237 lb 2 oz (107.6 kg)  11/10/21 229 lb (103.9 kg)  07/18/21 229 lb 3.2 oz (104 kg)     ASSESSMENT AND PLAN:  Problem List Items Addressed This Visit       Cardiology Problems   PAF (paroxysmal atrial fibrillation) ([place]) - Primary   Relevant Orders   EKG 12-Lead   Hyperlipidemia   Relevant Orders   EKG 12-Lead   Other Visit Diagnoses     Palpitations       Relevant Orders   EKG 12-Lead   PVC (premature ventricular contraction)         Paroxysmal atrial fibrillation [place] fib 2019, low CHADS VASC, not on anticoagulation On diltiazem, he will call us to confirm his dose before  refilling Unclear if he is taking 120 or 180 mg (our list details 120 mg)  metoprolol as needed  Premature ventricular contractions, symptomatic Rare episodes of PVCs Improved with diltiazem daily, also has metoprolol as needed September 2022, went to the emergency room, PVCs documented on EKG  Hyperlipidemia Cholesterol reasonable on Lipitor 10 He is trying to get weight down in preparation for daughter's wedding Minimal aortic atherosclerosis on CT scan, images reviewed   Total encounter time more than 30 minutes  Greater than 50% was spent in counseling and coordination of care with the patient    Signed, Esmond Plants, M.D., Ph.D. [place], [place]

## 2022-07-13 ENCOUNTER — Encounter: Payer: Self-pay | Admitting: Cardiovascular Disease

## 2022-07-13 ENCOUNTER — Ambulatory Visit: Payer: 59 | Attending: Cardiovascular Disease | Admitting: Cardiovascular Disease

## 2022-07-13 VITALS — BP 108/70 | HR 63 | Ht 72.0 in | Wt 237.1 lb

## 2022-07-13 DIAGNOSIS — I480 Paroxysmal atrial fibrillation: Secondary | ICD-10-CM

## 2022-07-13 DIAGNOSIS — I493 Ventricular premature depolarization: Secondary | ICD-10-CM | POA: Diagnosis not present

## 2022-07-13 DIAGNOSIS — E782 Mixed hyperlipidemia: Secondary | ICD-10-CM

## 2022-07-13 DIAGNOSIS — R002 Palpitations: Secondary | ICD-10-CM | POA: Diagnosis not present

## 2022-07-13 NOTE — Patient Instructions (Addendum)
Medication Instructions:  No changes  If you need a refill on your cardiac medications before your next appointment, please call your pharmacy.   Lab work: No new labs needed  Testing/Procedures: No new testing needed  Follow-Up: At [facility], you and your health needs are our priority.  As part of our continuing mission to provide you with exceptional heart care, we have created designated Provider Care Teams.  These Care Teams include your primary Cardiologist (physician) and Advanced Practice Providers (APPs -  Physician Assistants and Nurse Practitioners) who all work together to provide you with the care you need, when you need it.  You will need a follow up appointment in 12 months  Providers on your designated Care Team:   Christopher Berge, NP Ryan Dunn, PA-C Cadence Furth, PA-C  COVID-19 Vaccine Information can be found at: [email or web address] For questions related to vaccine distribution or appointments, please email [email or web address] or call [phone].   

## 2022-07-15 ENCOUNTER — Other Ambulatory Visit: Payer: Self-pay | Admitting: *Deleted

## 2022-07-15 ENCOUNTER — Other Ambulatory Visit: Payer: Self-pay

## 2022-07-15 MED ORDER — DILTIAZEM HCL ER COATED BEADS 180 MG PO CP24: 180.0000 mg | ORAL_CAPSULE | Freq: Every day | ORAL | 1 refills | Status: DC

## 2022-07-15 MED ORDER — DILTIAZEM HCL ER COATED BEADS 180 MG PO CP24: 180.0000 mg | ORAL_CAPSULE | Freq: Every day | ORAL | 3 refills | Status: DC

## 2022-07-15 NOTE — Progress Notes (Signed)
See MyChart encounter

## 2022-08-04 DIAGNOSIS — L570 Actinic keratosis: Secondary | ICD-10-CM | POA: Diagnosis not present

## 2022-08-04 DIAGNOSIS — D2262 Melanocytic nevi of left upper limb, including shoulder: Secondary | ICD-10-CM | POA: Diagnosis not present

## 2022-08-04 DIAGNOSIS — X32XXXA Exposure to sunlight, initial encounter: Secondary | ICD-10-CM | POA: Diagnosis not present

## 2022-08-04 DIAGNOSIS — D2271 Melanocytic nevi of right lower limb, including hip: Secondary | ICD-10-CM | POA: Diagnosis not present

## 2022-08-04 DIAGNOSIS — D2261 Melanocytic nevi of right upper limb, including shoulder: Secondary | ICD-10-CM | POA: Diagnosis not present

## 2022-08-04 DIAGNOSIS — Z85828 Personal history of other malignant neoplasm of skin: Secondary | ICD-10-CM | POA: Diagnosis not present

## 2022-08-21 DIAGNOSIS — Z8719 Personal history of other diseases of the digestive system: Secondary | ICD-10-CM | POA: Diagnosis not present

## 2022-08-21 DIAGNOSIS — K219 Gastro-esophageal reflux disease without esophagitis: Secondary | ICD-10-CM | POA: Diagnosis not present

## 2022-08-21 DIAGNOSIS — R1032 Left lower quadrant pain: Secondary | ICD-10-CM | POA: Diagnosis not present

## 2022-09-03 ENCOUNTER — Encounter: Payer: Self-pay | Admitting: Physician Assistant

## 2022-09-03 ENCOUNTER — Ambulatory Visit: Payer: Self-pay | Admitting: Physician Assistant

## 2022-09-03 ENCOUNTER — Other Ambulatory Visit: Payer: Self-pay | Admitting: Physician Assistant

## 2022-09-03 VITALS — BP 122/81 | HR 77 | Temp 98.2°F | Resp 12 | Ht 72.0 in | Wt 235.0 lb

## 2022-09-03 DIAGNOSIS — Z1152 Encounter for screening for COVID-19: Secondary | ICD-10-CM

## 2022-09-03 LAB — POC COVID19 BINAXNOW: SARS Coronavirus 2 Ag: NEGATIVE

## 2022-09-03 MED ORDER — IBUPROFEN 800 MG PO TABS: 800.0000 mg | ORAL_TABLET | Freq: Three times a day (TID) | ORAL | 0 refills | Status: DC | PRN

## 2022-09-03 MED ORDER — PROMETHAZINE-DM 6.25-15 MG/5ML PO SYRP: 5.0000 mL | ORAL_SOLUTION | Freq: Four times a day (QID) | ORAL | 0 refills | Status: DC | PRN

## 2022-09-03 MED ORDER — LIDOCAINE VISCOUS HCL 2 % MT SOLN: 5.0000 mL | Freq: Four times a day (QID) | OROMUCOSAL | 0 refills | Status: DC | PRN

## 2022-09-03 MED ORDER — PSEUDOEPH-BROMPHEN-DM 30-2-10 MG/5ML PO SYRP: 5.0000 mL | ORAL_SOLUTION | Freq: Four times a day (QID) | ORAL | 0 refills | Status: DC | PRN

## 2022-09-03 NOTE — Progress Notes (Signed)
COVID negative.

## 2022-09-03 NOTE — Progress Notes (Signed)
   Subjective: Cough and nasal congestion    Patient ID: Jesus Kent, male    DOB: 1960-11-08, 61 y.o.   MRN: [id number]  HPI Patient complains of 2 days of cough and nasal congestion.  States developed sore throat this morning which he believes is secondary to postnasal drainage.  Denies recent travel or known contact with COVID-19.  Patient tested negative for COVID-19 and influenza.   Review of Systems Negative septa chief complaint    Objective:   Physical Exam  BP is 122/81, pulse 77, respiration 12, temperature 98.2, patient 96% O2 sat on room air.  Patient weighs 235 pounds and BMI is 31.87. HEENT is remarkable for edematous nasal turbinates with postnasal drainage.  Pharynx is erythematous but no exudate. Neck is supple without lymphadenopathy or bruits. Lungs are clear to auscultation. Heart regular rate and rhythm.      Assessment & Plan: Upper respiratory infection   Patient given a prescription for Bromfed-DM, viscous lidocaine, ibuprofen.  Vies follow-up if no improvement or worsening complaints.

## 2022-09-07 ENCOUNTER — Other Ambulatory Visit: Payer: Self-pay | Admitting: Physician Assistant

## 2022-09-07 MED ORDER — AZITHROMYCIN 250 MG PO TABS: ORAL_TABLET | ORAL | 0 refills | Status: AC

## 2022-09-12 ENCOUNTER — Other Ambulatory Visit: Payer: Self-pay | Admitting: Cardiovascular Disease

## 2022-10-29 IMAGING — CR DG CHEST 2V
1 series · 2 of 2 positions shown · non-contrast
Comparison: 12/29/2017

CLINICAL DATA: Chest pain

EXAM:
CHEST - 2 VIEW

[Series 1: dg chest 2 view · 0.14mm/px · 2 of 2 slices shown]
[im 1/2]
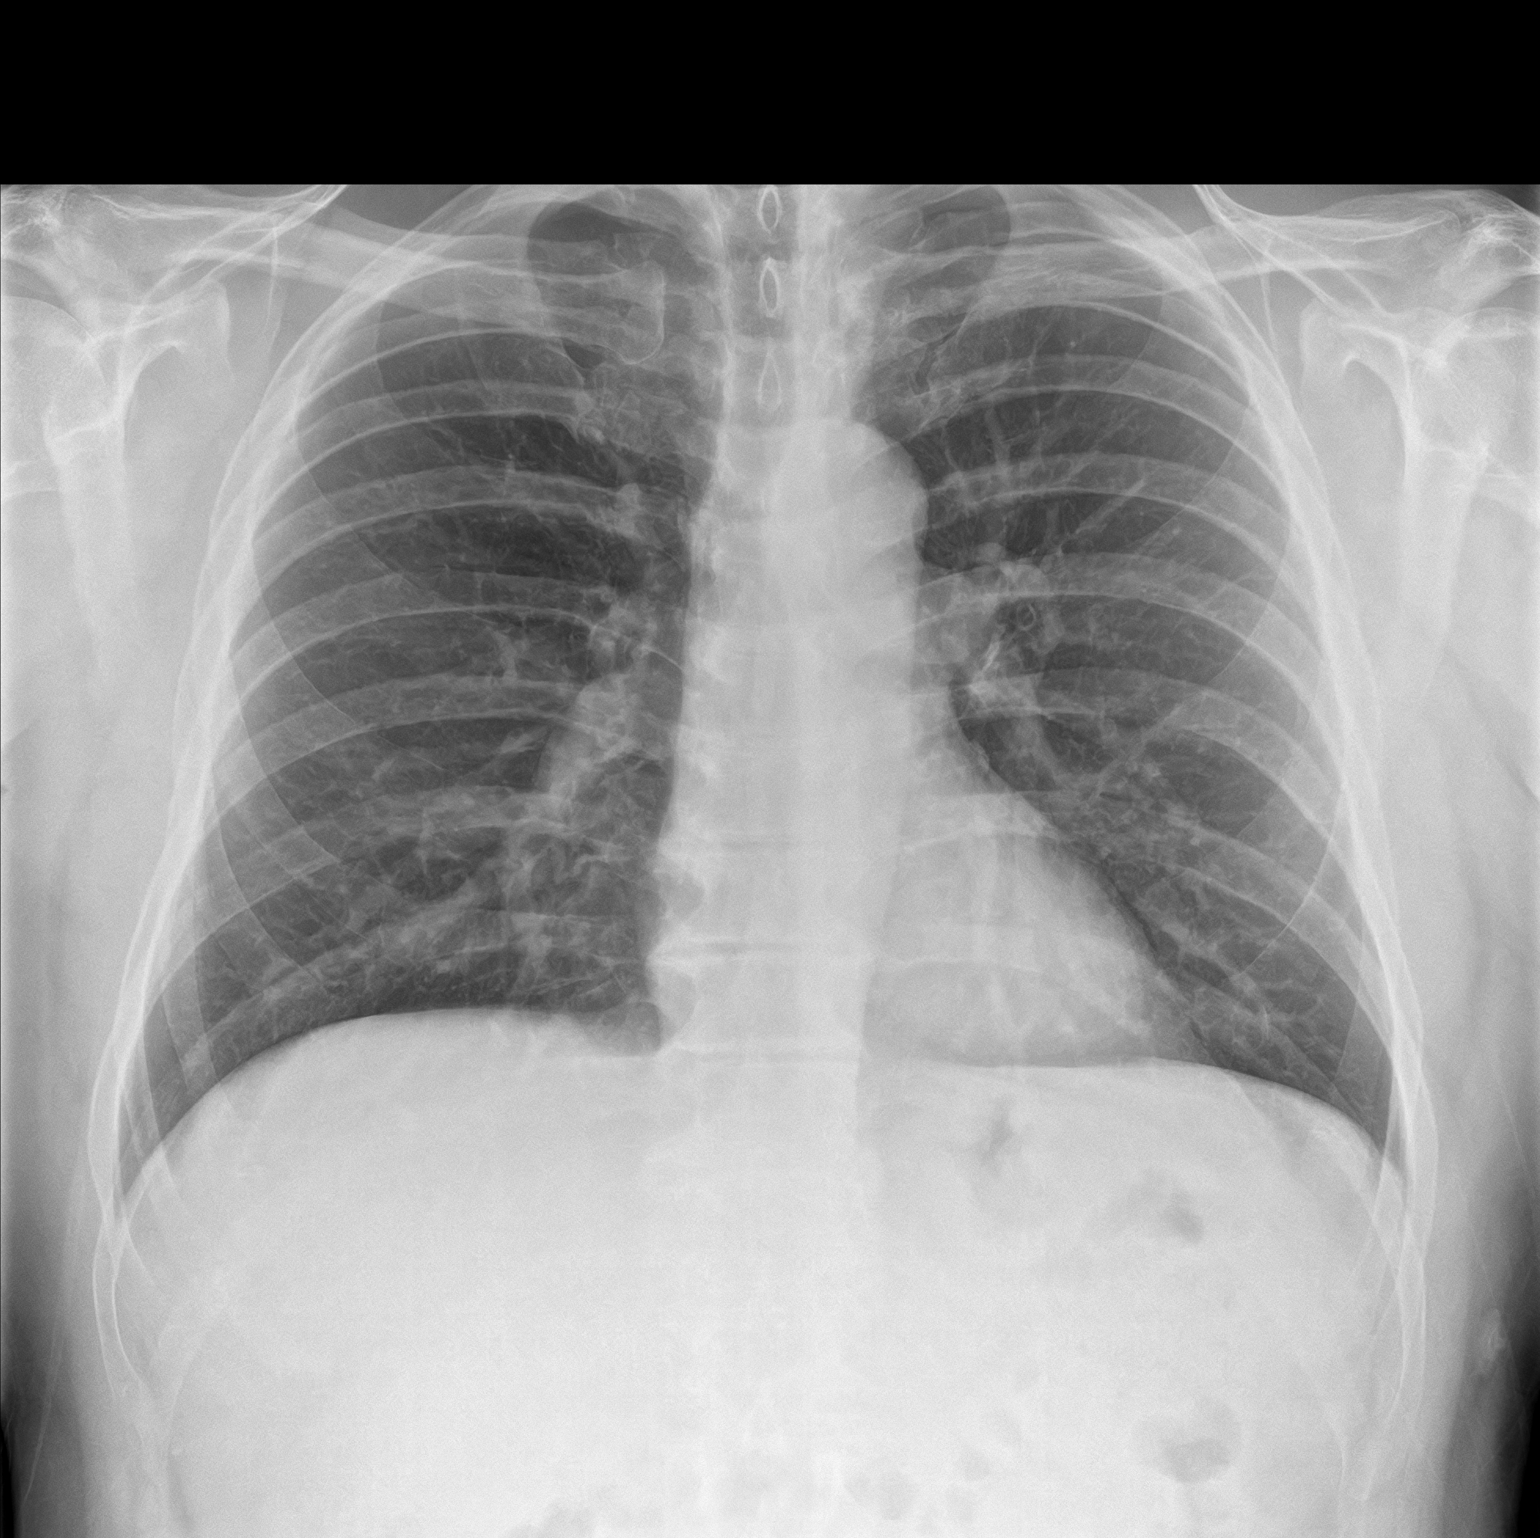
[im 2/2]
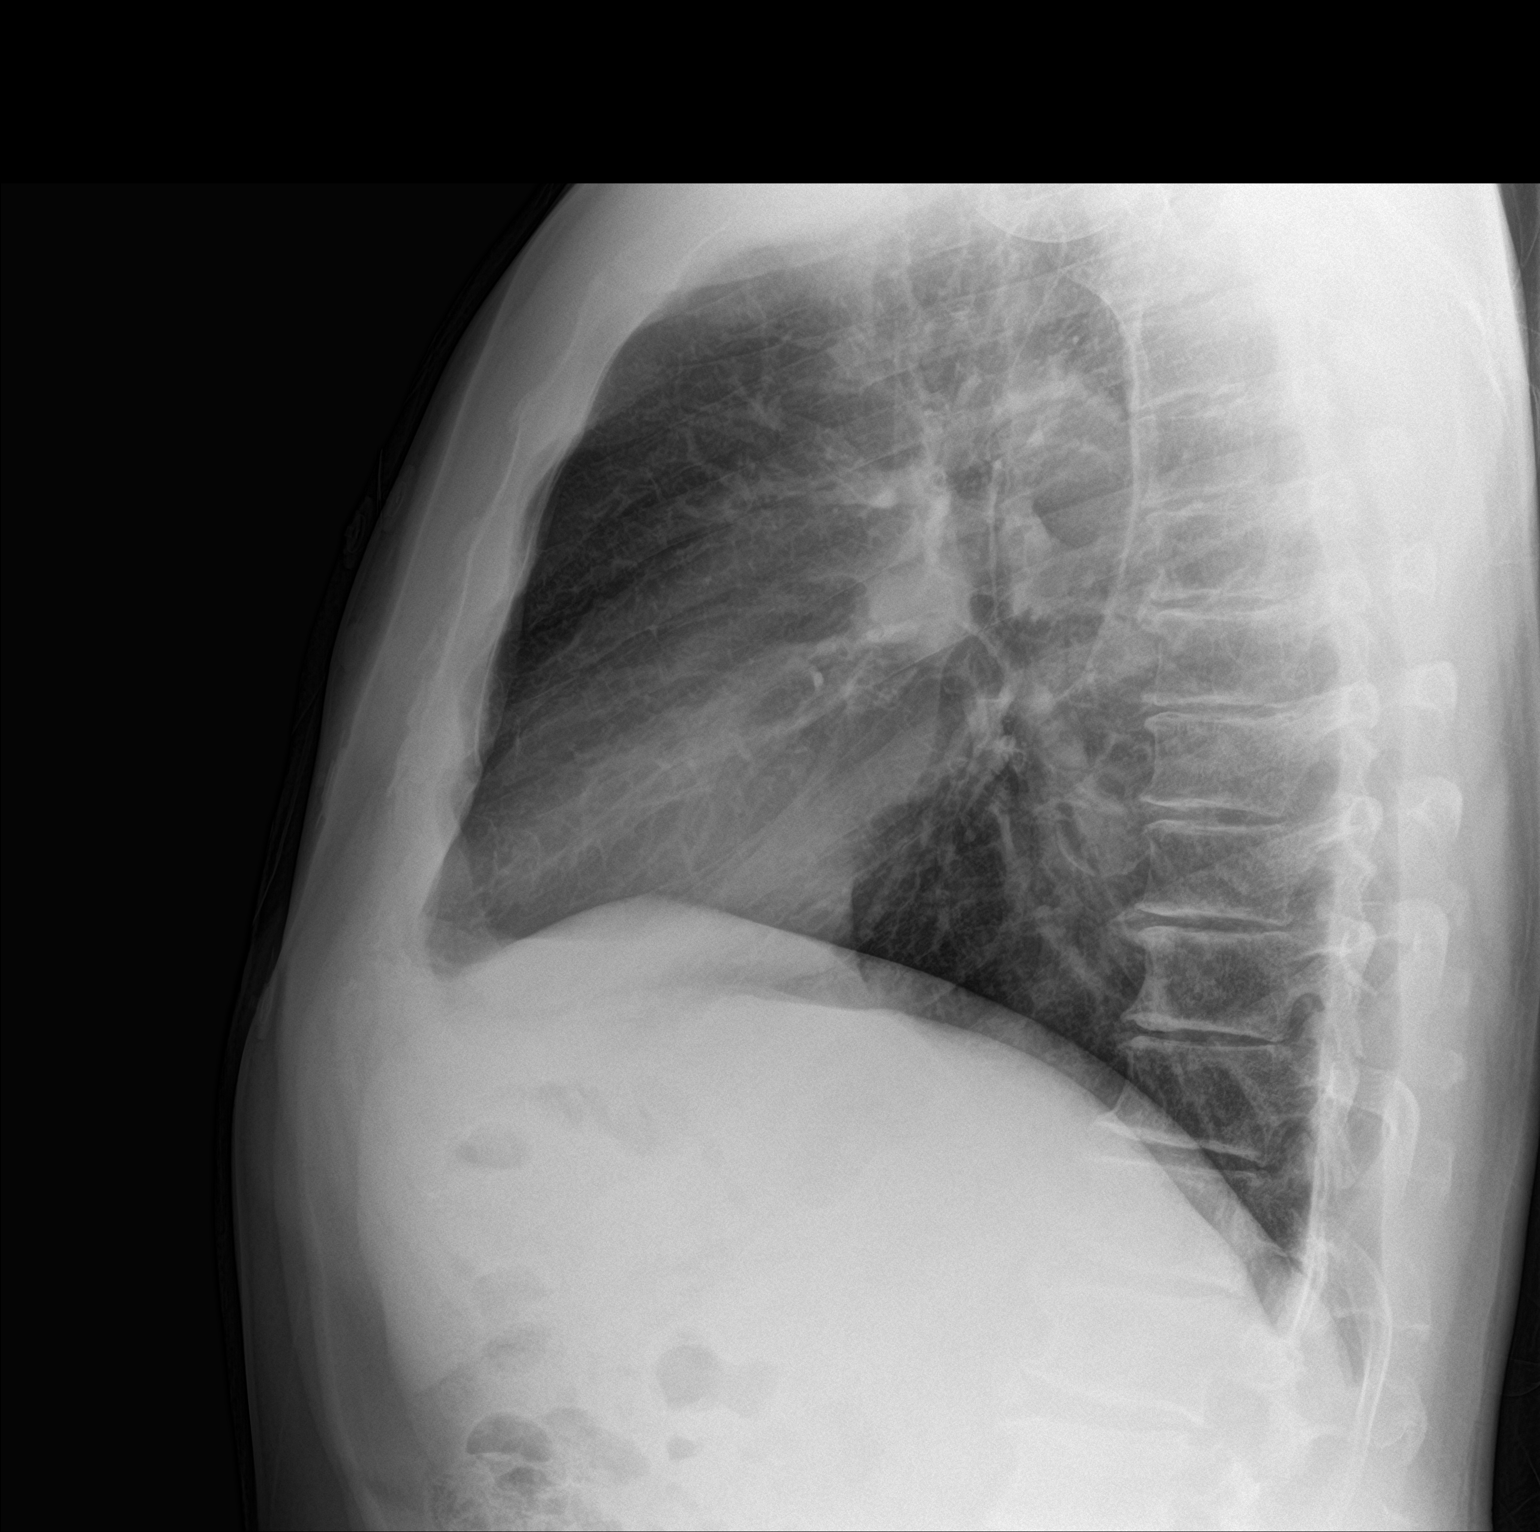

[2 of 2 positions shown; findings below may reference images not displayed]

FINDINGS: Heart and mediastinal contours are within normal limits. No focal
opacities or effusions. No acute bony abnormality.
IMPRESSION: No active cardiopulmonary disease.

## 2022-11-30 DIAGNOSIS — M1711 Unilateral primary osteoarthritis, right knee: Secondary | ICD-10-CM | POA: Diagnosis not present

## 2022-11-30 DIAGNOSIS — M1831 Unilateral post-traumatic osteoarthritis of first carpometacarpal joint, right hand: Secondary | ICD-10-CM | POA: Diagnosis not present

## 2022-12-07 ENCOUNTER — Ambulatory Visit: Payer: Self-pay | Admitting: Physician Assistant

## 2022-12-07 ENCOUNTER — Other Ambulatory Visit: Payer: Self-pay | Admitting: Physician Assistant

## 2022-12-07 ENCOUNTER — Encounter: Payer: Self-pay | Admitting: Physician Assistant

## 2022-12-07 DIAGNOSIS — S39012A Strain of muscle, fascia and tendon of lower back, initial encounter: Secondary | ICD-10-CM

## 2022-12-07 DIAGNOSIS — M5440 Lumbago with sciatica, unspecified side: Secondary | ICD-10-CM

## 2022-12-07 MED ORDER — ORPHENADRINE CITRATE ER 100 MG PO TB12: 100.0000 mg | ORAL_TABLET | Freq: Two times a day (BID) | ORAL | 0 refills | Status: DC

## 2022-12-07 MED ORDER — NAPROXEN 500 MG PO TABS: 500.0000 mg | ORAL_TABLET | Freq: Two times a day (BID) | ORAL | Status: DC

## 2022-12-07 NOTE — Progress Notes (Signed)
Pt presents today with telephone visit on his back pain since yesterday. Pt states he was walkin [place] the beach and bent over to pick a seashell up and felt some pull in center of lower back. Burna Sis

## 2022-12-07 NOTE — Progress Notes (Signed)
   Subjective: Lumbar strain    Patient ID: Jesus Kent, male    DOB: October 22, 1960, 62 y.o.   MRN: [id number]  HPI Patient complain of low back pain mostly on the right side secondary to a bending incident at the beach yesterday.  Patient denies radicular component to pain.  Patient denies bladder or bowel dysfunction.   Review of Systems Negative except for chief complaint.    Objective:   Physical Exam  Physical exam deferred secondary to this being a telephonic encounter.      Assessment & Plan: Lumbar strain   Patient is amenable to a trial of Norflex and naproxen.  Will follow-up if no improvement or worsening complaint in 3 days.

## 2022-12-09 DIAGNOSIS — M545 Low back pain, unspecified: Secondary | ICD-10-CM | POA: Diagnosis not present

## 2022-12-21 DIAGNOSIS — M1711 Unilateral primary osteoarthritis, right knee: Secondary | ICD-10-CM | POA: Diagnosis not present

## 2022-12-28 DIAGNOSIS — M1711 Unilateral primary osteoarthritis, right knee: Secondary | ICD-10-CM | POA: Diagnosis not present

## 2023-01-04 DIAGNOSIS — M1711 Unilateral primary osteoarthritis, right knee: Secondary | ICD-10-CM | POA: Diagnosis not present

## 2023-01-21 ENCOUNTER — Ambulatory Visit: Payer: Self-pay

## 2023-01-21 DIAGNOSIS — Z0000 Encounter for general adult medical examination without abnormal findings: Secondary | ICD-10-CM

## 2023-01-21 LAB — POCT URINALYSIS DIPSTICK
Bilirubin, UA: NEGATIVE
Blood, UA: NEGATIVE
Glucose, UA: NEGATIVE
Ketones, UA: NEGATIVE
Leukocytes, UA: NEGATIVE
Nitrite, UA: NEGATIVE
Protein, UA: NEGATIVE
Spec Grav, UA: 1.01 (ref 1.010–1.025)
Urobilinogen, UA: 0.2 E.U./dL
pH, UA: 6 (ref 5.0–8.0)

## 2023-01-21 NOTE — Progress Notes (Signed)
  Pt completed lab portion of sceduled physical.

## 2023-01-22 LAB — CMP12+LP+TP+TSH+6AC+PSA+CBC…
ALT: 19 IU/L (ref 0–44)
AST: 22 IU/L (ref 0–40)
Albumin/Globulin Ratio: 1.8 (ref 1.2–2.2)
Albumin: 4.2 g/dL (ref 3.8–4.9)
Alkaline Phosphatase: 77 IU/L (ref 44–121)
BUN/Creatinine Ratio: 17 (ref 10–24)
BUN: 16 mg/dL (ref 8–27)
Basophils Absolute: 0.1 10*3/uL (ref 0.0–0.2)
Basos: 1 %
Bilirubin Total: 0.4 mg/dL (ref 0.0–1.2)
Calcium: 9.3 mg/dL (ref 8.6–10.2)
Chloride: 105 mmol/L (ref 96–106)
Chol/HDL Ratio: 3.4 ratio (ref 0.0–5.0)
Cholesterol, Total: 138 mg/dL (ref 100–199)
Creatinine, Ser: 0.96 mg/dL (ref 0.76–1.27)
EOS (ABSOLUTE): 0.1 10*3/uL (ref 0.0–0.4)
Eos: 2 %
Estimated CHD Risk: 0.5 times avg. (ref 0.0–1.0)
Free Thyroxine Index: 1.6 (ref 1.2–4.9)
GGT: 12 IU/L (ref 0–65)
Globulin, Total: 2.4 g/dL (ref 1.5–4.5)
Glucose: 81 mg/dL (ref 70–99)
HDL: 41 mg/dL (ref >39)
Hematocrit: 44.4 % (ref 37.5–51.0)
Hemoglobin: 15.4 g/dL (ref 13.0–17.7)
Immature Grans (Abs): 0 10*3/uL (ref 0.0–0.1)
Immature Granulocytes: 0 %
Iron: 89 ug/dL (ref 38–169)
LDH: 202 IU/L (ref 121–224)
LDL Chol Calc (NIH): 77 mg/dL (ref 0–99)
Lymphocytes Absolute: 1.9 10*3/uL (ref 0.7–3.1)
Lymphs: 36 %
MCH: 30.9 pg (ref 26.6–33.0)
MCHC: 34.7 g/dL (ref 31.5–35.7)
MCV: 89 fL (ref 79–97)
Monocytes Absolute: 0.6 10*3/uL (ref 0.1–0.9)
Monocytes: 10 %
Neutrophils Absolute: 2.7 10*3/uL (ref 1.4–7.0)
Neutrophils: 51 %
Phosphorus: 3.6 mg/dL (ref 2.8–4.1)
Platelets: 216 10*3/uL (ref 150–450)
Potassium: 4.1 mmol/L (ref 3.5–5.2)
Prostate Specific Ag, Serum: 1.7 ng/mL (ref 0.0–4.0)
RBC: 4.99 x10E6/uL (ref 4.14–5.80)
RDW: 12.7 % (ref 11.6–15.4)
Sodium: 142 mmol/L (ref 134–144)
T3 Uptake Ratio: 23 % — ABNORMAL LOW (ref 24–39)
T4, Total: 7 ug/dL (ref 4.5–12.0)
TSH: 5.2 u[IU]/mL — ABNORMAL HIGH (ref 0.450–4.500)
Total Protein: 6.6 g/dL (ref 6.0–8.5)
Triglycerides: 110 mg/dL (ref 0–149)
Uric Acid: 5.2 mg/dL (ref 3.8–8.4)
VLDL Cholesterol Cal: 20 mg/dL (ref 5–40)
WBC: 5.3 10*3/uL (ref 3.4–10.8)
eGFR: 90 mL/min/{1.73_m2} (ref >59)

## 2023-01-28 ENCOUNTER — Encounter: Payer: Self-pay | Admitting: Physician Assistant

## 2023-01-28 ENCOUNTER — Ambulatory Visit: Payer: Self-pay | Admitting: Physician Assistant

## 2023-01-28 VITALS — BP 121/76 | HR 70 | Temp 98.0°F | Resp 14 | Ht 72.0 in | Wt 230.0 lb

## 2023-01-28 DIAGNOSIS — R7989 Other specified abnormal findings of blood chemistry: Secondary | ICD-10-CM

## 2023-01-28 DIAGNOSIS — R972 Elevated prostate specific antigen [PSA]: Secondary | ICD-10-CM

## 2023-01-28 DIAGNOSIS — Z0000 Encounter for general adult medical examination without abnormal findings: Secondary | ICD-10-CM

## 2023-01-28 NOTE — Progress Notes (Signed)
[facility]   ____________________________________________   None    (approximate)  I have reviewed the triage vital signs and the nursing notes.   HISTORY  Chief Complaint No chief complaint on file.   HPI Jesus Kent is a 62 y.o. male          Past Medical History:  Diagnosis Date   Atrial fibrillation ([facility])    Herpes simplex type 2 infection    [facility]   Hyperlipidemia     Patient Active Problem List   Diagnosis Date Noted   Diverticulitis of colon 06/2008 08/13/2020   Seasonal rhinitis 08/13/2020   GERD (gastroesophageal reflux disease) 08/13/2020   PAF (paroxysmal atrial fibrillation) 12/10/2019   Hyperlipidemia 12/10/2019   Bradycardia 07/20/2018   History of right hip replacement 05/28/2017   Obesity (BMI 30-39.9) 04/20/2017   Follow-up examination after orthopedic surgery 03/16/2017   Osteoarthritis of knee 09/04/2013   Ulnar nerve entrapment at elbow 09/04/2013   Heterotopic ossification of bone 04/06/2013    Past Surgical History:  Procedure Laterality Date   COLONOSCOPY WITH PROPOFOL N/A 05/26/2016   Procedure: COLONOSCOPY WITH PROPOFOL;  Surgeon: Christena Deem, MD;  Location: [facility] ENDOSCOPY;  Service: Endoscopy;  Laterality: N/A;   ELBOW SURGERY Left    surgery to elbow left x 2   JOINT REPLACEMENT     knee surgery x3 Right    TOTAL HIP ARTHROPLASTY      Prior to Admission medications   Medication Sig Start Date End Date Taking? Authorizing Provider  atorvastatin (LIPITOR) 10 MG tablet TAKE 1 TABLET BY MOUTH EVERY DAY 09/14/22  Yes Gollan, Tollie Pizza, MD  Cholecalciferol 25 MCG (1000 UT) tablet Take 1,000 Units by mouth daily.   Yes [provider]  diltiazem (CARDIZEM CD) 180 MG 24 hr capsule TAKE 1 CAPSULE (180 MG TOTAL) BY MOUTH ONCE DAILY   Yes [provider]  magnesium oxide (MAG-OX) 400 MG tablet Take 400 mg by mouth daily.   Yes [provider]   MULTIPLE VITAMINS PO Take 1 tablet by mouth daily.   Yes [provider]  Omega-3 Fatty Acids (FISH OIL) 1000 MG CAPS Take by mouth.   Yes [provider]  Zinc Sulfate (ZINC 15 PO) Take by mouth.   Yes [provider]  aspirin 81 MG EC tablet Take by mouth. Patient not taking: Reported on 01/28/2023 05/11/17   [provider]  co-enzyme Q-10 30 MG capsule Take 50 mg by mouth daily. Patient not taking: Reported on 01/28/2023    [provider]  metoprolol tartrate (LOPRESSOR) 25 MG tablet Take 1 tablet (25 mg total) by mouth 2 (two) times daily as needed. For PVCs/palpitations Patient not taking: Reported on 01/28/2023 07/09/21   Antonieta Iba, MD    Allergies Patient has no known allergies.  Family History  Problem Relation Age of Onset   Hyperlipidemia Mother    Heart disease Father        stent     Social History Social History   Tobacco Use   Smoking status: Never   Smokeless tobacco: Former  Building services engineer Use: Never used  Substance Use Topics   Alcohol use: Never    Comment: rare   Drug use: No    Review of Systems Constitutional: No fever/chills Eyes: No visual changes. ENT: No sore throat. Cardiovascular: Denies chest pain.  A-fib Respiratory: Denies shortness of breath. Gastrointestinal: No  abdominal pain.  No nausea, no vomiting.  No diarrhea.  No constipation. Genitourinary: Negative for dysuria. Musculoskeletal: Negative for back pain. Skin: Negative for rash. Neurological: Negative for headaches, focal weakness or numbness. Endocrine: Hyperlipidemia  ____________________________________________   PHYSICAL EXAM:  VITAL SIGNS: Pulse 70  Resp 14  Temp 98 F (36.7 C)  SpO2 96 %  Weight 230 lb (104.3 kg)  Height 6' (1.829 m)   BMI 31.19 kg/m2  BSA 2.30 m2   Constitutional: Alert and oriented. Well appearing and in no acute distress. Eyes: Conjunctivae are normal. PERRL. EOMI. Head:  Atraumatic. Nose: No congestion/rhinnorhea. Mouth/Throat: Mucous membranes are moist.  Oropharynx non-erythematous. Neck: No stridor.  No cervical spine tenderness to palpation. Hematological/Lymphatic/Immunilogical: No cervical lymphadenopathy. Cardiovascular: Normal rate, regular rhythm. Grossly normal heart sounds.  Good peripheral circulation. Respiratory: Normal respiratory effort.  No retractions. Lungs CTAB. Gastrointestinal: Soft and nontender. No distention. No abdominal bruits. No CVA tenderness. Genitourinary: Deferred Musculoskeletal: No lower extremity tenderness nor edema.  No joint effusions. Neurologic:  Normal speech and language. No gross focal neurologic deficits are appreciated. No gait instability. Skin:  Skin is warm, dry and intact. No rash noted. Psychiatric: Mood and affect are normal. Speech and behavior are normal.  ____________________________________________   LABS        Component Ref Range & Units 7 d ago 11 mo ago 1 yr ago 2 yr ago  Color, UA yellow dark yellow Yellow Yellow  Clarity, UA clear cloudy Clear Clear  Glucose, UA Negative Negative Negative Negative Negative  Bilirubin, UA neg negative Negative Negative  Ketones, UA neg negative Negative Negative  Spec Grav, UA 1.010 - 1.025 1.010 1.025 1.015 1.025  Blood, UA neg trace CM Negative Negative  pH, UA 5.0 - 8.0 6.0 6.0 7.0 6.0  Protein, UA Negative Negative Negative Negative Negative  Urobilinogen, UA 0.2 or 1.0 E.U./dL 0.2 0.2 0.2 0.2  Nitrite, UA neg negative Negative Negative  Leukocytes, UA Negative Negative Negative Negative Negative  Appearance  scant urine    Odor                      Component Ref Range & Units 7 d ago (01/21/23) 7 mo ago (06/03/22) 11 mo ago (02/03/22) 1 yr ago (06/11/21) 1 yr ago (06/11/21) 1 yr ago (02/21/21) 2 yr ago (07/18/20) 2 yr ago (07/18/20)  Glucose 70 - 99 mg/dL 81  92 97 CM  84 R 92 R   Uric Acid 3.8 - 8.4 mg/dL 5.2  5.7 CM   4.6 CM     Comment:            Therapeutic target for gout patients: <6.0  BUN 8 - 27 mg/dL 16  16 R 19 R  15 R 15 R   Creatinine, Ser 0.76 - 1.27 mg/dL 1.61 0.96 R 0.45 4.09 R  0.91 0.90   eGFR >59 mL/min/1.73 90  87   97    BUN/Creatinine Ratio 10 - R   16 R 17 R   Sodium 134 - 144 mmol/L 142  142 139 R  138 139   Potassium 3.5 - 5.2 mmol/L 4.1  4.2 4.0 R  4.4 4.0   Chloride 96 - 106 mmol/L 105  105 106 R  101 103   Calcium 8.6 - 10.2 mg/dL 9.3  9.5 R 9.2 R  9.6 R 9.5 R   Phosphorus 2.8 - 4.1 mg/dL 3.6  3.9  3.3    Total Protein 6.0 - 8.5 g/dL 6.6  7.4   7.4    Albumin 3.8 - 4.9 g/dL 4.2  4.7   4.6    Globulin, Total 1.5 - 4.5 g/dL 2.4  2.7   2.8    Albumin/Globulin Ratio 1.2 - 2.2 1.8  1.7   1.6    Bilirubin Total 0.0 - 1.2 mg/dL 0.4  0.6   0.7    Alkaline Phosphatase 44 - 121 IU/L 77  75   73    LDH 121 - 224 IU/L 202  222   223    AST 0 - 40 IU/L 22  22   20     ALT 0 - 44 IU/L 19  22   19     GGT 0 - 65 IU/L 12  16   14     Iron 38 - 169 ug/dL 89  84   098    Cholesterol, Total 100 - 199 mg/dL 119  147   829    Triglycerides 0 - 149 mg/dL 562  130   68    HDL >86 mg/dL 41  46   52    VLDL Cholesterol Cal 5 - 40 mg/dL 20  21   14     LDL Chol Calc (NIH) 0 - 99 mg/dL 77  89   76    Chol/HDL Ratio 0.0 - 5.0 ratio 3.4  3.4 CM   2.7 CM    Comment:                                   T. Chol/HDL Ratio                                             Men  Women                               1/2 Avg.Risk  3.4    3.3                                   Avg.Risk  5.0    4.4                                2X Avg.Risk  9.6    7.1                                3X Avg.Risk 23.4   11.0  Estimated CHD Risk 0.0 - 1.0 times avg. 0.5  0.5 CM    < 0.5 CM    Comment: The CHD Risk is based on the T. Chol/HDL ratio. Other factors affect CHD Risk such as hypertension, smoking, diabetes, severe obesity, and family history of premature CHD.  TSH 0.450 - 4.500 uIU/mL 5.200 High    2.800   3.590    T4, Total 4.5 - 12.0 ug/dL 7.0  9.0   7.9    T3 Uptake Ratio 24 - 39 % 23 Low   25   23 Low     Free Thyroxine Index  1.2 - 4.9 1.6  2.3   1.8    Prostate Specific Ag, Serum 0.0 - 4.0 ng/mL 1.7  0.6 CM   0.5 CM    Comment: Roche ECLIA methodology. According to the American Urological Association, Serum PSA should decrease and remain at undetectable levels after radical prostatectomy. The AUA defines biochemical recurrence as an initial PSA value 0.2 ng/mL or greater followed by a subsequent confirmatory PSA value 0.2 ng/mL or greater. Values obtained with different assay methods or kits cannot be used interchangeably. Results cannot be interpreted as absolute evidence of the presence or absence of malignant disease.  WBC 3.4 - 10.8 x10E3/uL 5.3  6.3  9.7 R 6.9  7.2  RBC 4.14 - 5.80 x10E6/uL 4.99  5.39  5.10 R 5.39  4.93  Hemoglobin 13.0 - 17.7 g/dL 40.9  81.1  91.4 R 78.2  15.1  Hematocrit 37.5 - 51.0 % 44.4  47.4  44.9 R 47.9  43.3  MCV 79 - 97 fL 89  88  88.0 R 89  88  MCH 26.6 - 33.0 pg 30.9  30.8  31.4 R 29.7  30.6  MCHC 31.5 - 35.7 g/dL 95.6  21.3  08.6 R 57.8  34.9  RDW 11.6 - 15.4 % 12.7  12.9  12.9 R 13.1  12.5  Platelets 150 - 450 x10E3/uL 216  203  193 R 219  186  Neutrophils Not Estab. % 51  51   58  54  Lymphs Not Estab. % 36  37   30  33  Monocytes Not Estab. % 10  9   10  10   Eos Not Estab. % 2  2   1  2   Basos Not Estab. % 1  1   1  1   Neutrophils Absolute 1.4 - 7.0 x10E3/uL 2.7  3.2   3.9  3.9  Lymphocytes Absolute 0.7 - 3.1 x10E3/uL 1.9  2.4   2.1  2.4  Monocytes Absolute 0.1 - 0.9 x10E3/uL 0.6  0.6   0.7  0.7  EOS (ABSOLUTE) 0.0 - 0.4 x10E3/uL 0.1  0.1   0.1  0.1  Basophils Absolute 0.0 - 0.2 x10E3/uL 0.1  0.0   0.1  0.1  Immature Granulocytes Not Estab. % 0  0   0  0  Immature Grans               ____________________________________________  EKG  Sinus bradycardia 54  bpm ____________________________________________   ____________________________________________      ____________________________________________   INITIAL IMPRESSION / ASSESSMENT AND PLAN  As part of my medical decision making, I reviewed the following data within the electronic MEDICAL RECORD NUMBER      No acute findings on physical exam.  Discussed elevated TSH and PSA.  Patient stated no history of thyroid disease in family.  Patient states 2 years ago levels were also elevated and recheck and found to be normal.  Will redraw blood for recheck up elevated TSH and PSA.        ____________________________________________   FINAL CLINICAL IMPRESSION(     ED Discharge Orders     None        Note:  This document was prepared using Dragon voice recognition software and may include unintentional dictation errors.

## 2023-01-28 NOTE — Progress Notes (Signed)
Here for yearly physical with [employer].  Denies any complaints.

## 2023-01-30 LAB — TSH: TSH: 3.93 u[IU]/mL (ref 0.450–4.500)

## 2023-01-30 LAB — PSA: Prostate Specific Ag, Serum: 1.1 ng/mL (ref 0.0–4.0)

## 2023-02-15 ENCOUNTER — Encounter: Payer: Self-pay | Admitting: Physician Assistant

## 2023-02-15 ENCOUNTER — Ambulatory Visit: Payer: Self-pay | Admitting: Physician Assistant

## 2023-02-15 DIAGNOSIS — U071 COVID-19: Secondary | ICD-10-CM

## 2023-02-15 DIAGNOSIS — R051 Acute cough: Secondary | ICD-10-CM

## 2023-02-15 DIAGNOSIS — J3489 Other specified disorders of nose and nasal sinuses: Secondary | ICD-10-CM

## 2023-02-15 LAB — POCT INFLUENZA A/B
Influenza A, POC: NEGATIVE
Influenza B, POC: NEGATIVE

## 2023-02-15 LAB — POC COVID19 BINAXNOW: SARS Coronavirus 2 Ag: POSITIVE — AB

## 2023-02-15 MED ORDER — NIRMATRELVIR/RITONAVIR (PAXLOVID)TABLET
3.0000 | ORAL_TABLET | Freq: Two times a day (BID) | ORAL | 0 refills | Status: AC
Start: 2023-02-15 — End: 2023-02-20

## 2023-02-15 MED ORDER — FEXOFENADINE-PSEUDOEPHED ER 60-120 MG PO TB12: 1.0000 | ORAL_TABLET | Freq: Two times a day (BID) | ORAL | 0 refills | Status: DC

## 2023-02-15 MED ORDER — BENZONATATE 200 MG PO CAPS: 200.0000 mg | ORAL_CAPSULE | Freq: Two times a day (BID) | ORAL | 0 refills | Status: DC | PRN

## 2023-02-15 NOTE — Progress Notes (Signed)
S/Sx x2 days: Productive cough - green phlegm Facial pain/pressure Scratchy throat Denies fever, ear discomfort  OTC Meds:  Dayquil  Had Covid in 2020 Has had 4 covid vaccines  Rapid Covid Test at home yesterday was negative  Rapid Covid Test at Clinic today = positive  AMD

## 2023-02-15 NOTE — Progress Notes (Signed)
   Subjective: Sinus congestion    Patient ID: Jesus Kent, male    DOB: 12-04-1960, 62 y.o.   MRN: [id number]  HPI Patient complains of sinus congestion and productive cough for 2 weeks.  Patient denies fever/chills.  Patient denies recent travel or known contact with COVID-19.  Patient took a home test for COVID-19 yesterday and was negative.  Patient took a home test today was positive.  Patient took a test in the clinic today that was positive.   Review of Systems GERD, PAF, and seasonal rhinitis.    Objective:   Physical Exam Physical exam was deferred since is a telephonic encounter       Assessment & Plan: COVID 19   Discussed recent CDC recommendation for positive COVID.  Patient given a prescription for Paxlovid, Allegra-D, Tessalon Perles.

## 2023-02-17 ENCOUNTER — Ambulatory Visit: Payer: Self-pay | Admitting: Physician Assistant

## 2023-02-17 ENCOUNTER — Telehealth: Payer: Self-pay

## 2023-02-17 ENCOUNTER — Encounter: Payer: Self-pay | Admitting: Physician Assistant

## 2023-02-17 DIAGNOSIS — U071 COVID-19: Secondary | ICD-10-CM

## 2023-02-17 NOTE — Telephone Encounter (Signed)
Answered questions post covid positive results on 02/15/23 with clinic test.  Stated cough improved and denied dyspnea.  Stated taking the Paxlovid and Allegra-D.  Stated he will try OTC pain meds if body aches and call if symptoms worsen and follow covid isolation during symptoms.

## 2023-02-17 NOTE — Progress Notes (Signed)
After phone call with nurse this am, he returned call with concern that he needs provider to listen to his lungs.  Denies dyspnea to nurse and told nurse that cough had improved.  Stated he was currently taking the Paxlovid and Allegra-D post covid positive on 02/15/23 at clinic test and had telephone consult with provider that day.

## 2023-02-17 NOTE — Progress Notes (Signed)
   Subjective: COVID-19    Patient ID: Jesus Kent, male    DOB: [birth year], 62 y.o.   MRN: [id number]  HPI  Patient is follow-up status post diagnosis of COVID-19 2 days ago.  Patient started Paxlovid, Allegra-D, and Tessalon Perles.  Patient states symptoms has improved but he is concerned that it might be some lung involvement.  Review of Systems Seasonal rhinitis, GERD, and PAF.    Objective:   Physical Exam  Physical exam was limited as patient was evaluated parking lot.  Emphasis placed to auscultation of lungs which are clear.      Assessment & Plan: COVID-19   Advised patient do not see the need for steroids at this time.  Continue previous medication and follow CDC recommendation.  Follow-up if condition worsens.

## 2023-04-05 DIAGNOSIS — H5203 Hypermetropia, bilateral: Secondary | ICD-10-CM | POA: Diagnosis not present

## 2023-05-25 DIAGNOSIS — D2262 Melanocytic nevi of left upper limb, including shoulder: Secondary | ICD-10-CM | POA: Diagnosis not present

## 2023-05-25 DIAGNOSIS — L814 Other melanin hyperpigmentation: Secondary | ICD-10-CM | POA: Diagnosis not present

## 2023-05-25 DIAGNOSIS — D225 Melanocytic nevi of trunk: Secondary | ICD-10-CM | POA: Diagnosis not present

## 2023-05-25 DIAGNOSIS — D2272 Melanocytic nevi of left lower limb, including hip: Secondary | ICD-10-CM | POA: Diagnosis not present

## 2023-05-25 DIAGNOSIS — D2261 Melanocytic nevi of right upper limb, including shoulder: Secondary | ICD-10-CM | POA: Diagnosis not present

## 2023-05-25 DIAGNOSIS — D2271 Melanocytic nevi of right lower limb, including hip: Secondary | ICD-10-CM | POA: Diagnosis not present

## 2023-06-06 ENCOUNTER — Other Ambulatory Visit: Payer: Self-pay | Admitting: Cardiovascular Disease

## 2023-07-12 NOTE — Progress Notes (Unsigned)
Cardiology Office Note  Date:  07/13/2023   ID:  Jesus Kent, DOB 29-Aug-1961, MRN [id number]  PCP:  Patient, No Pcp Per   Chief Complaint  Patient presents with   Follow-up    No new cardiac issues or concerns    HPI:  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, Active, trying to lose weight Weight is stable over the past year No chest pain or shortness of breath on exertion Denies tachypalpitations concerning for arrhythmia Tolerating current dose of diltiazem Has not had to take metoprolol  No other episodes of atrial fibrillation apart from 2019  Total cholesterol 130 range LDL 77 on Lipitor 10  CT abdomen  Minimal aortic atherosclerosis on CT scan Previously seen in 2022  EKG personally reviewed by myself on todays visit EKG Interpretation Date/Time:  Tuesday July 13 2023 08:11:50 EDT Ventricular Rate:  70 PR Interval:  146 QRS Duration:  98 QT Interval:  392 QTC Calculation: 423 R Axis:   -36  Text Interpretation: Normal sinus rhythm Left axis deviation Incomplete right bundle branch block When compared with ECG of 11-Jun-2021 01:08, Premature ventricular complexes are no longer Present Incomplete right bundle branch block is now Present Confirmed by Julien Nordmann [phone]) on 07/13/2023 8:32:32 AM    Past medical history reviewed Seen in ER 06/11/2021 for symptomatic palpitations, developed that evening and persisted until 6 AM Work-up in the emergency room reviewed showing PVCs on EKG Lab work normal Symptoms resolved on their own   Lone atrial fib 2019 Went to ER,  started on medication for rate control, converted to normal sinus rhythm echocardiogram stress test, both essentially normal  Echo 01/2018 NORMAL LEFT VENTRICULAR SYSTOLIC FUNCTION WITH AN ESTIMATED EF = 55 % NORMAL RIGHT VENTRICULAR SYSTOLIC FUNCTION MILD TRICUSPID AND MITRAL VALVE INSUFFICIENCY NO VALVULAR STENOSIS MILD BIATRIAL ENLARGEMENT  CT scan chest, CT scan abdomen pelvis, no significant aortic atherosclerosis no coronary calcification   PMH:   has a past medical history of Atrial fibrillation (HCC), Herpes simplex type 2 infection, and Hyperlipidemia.  PSH:    Past Surgical History:  Procedure Laterality Date   COLONOSCOPY WITH PROPOFOL N/A 05/26/2016   Procedure: COLONOSCOPY WITH PROPOFOL;  Surgeon: Christena Deem, MD;  Location: [facility] ENDOSCOPY;  Service: Endoscopy;  Laterality: N/A;   ELBOW SURGERY Left    surgery to elbow left x 2   JOINT REPLACEMENT     knee surgery x3 Right    TOTAL HIP ARTHROPLASTY      Current Outpatient Medications  Medication Sig Dispense Refill   atorvastatin (LIPITOR) 10 MG tablet TAKE 1 TABLET BY MOUTH EVERY DAY 90 tablet 0   celecoxib (CELEBREX) 200 MG capsule Take 200 mg by mouth daily.     diltiazem (CARDIZEM CD) 180 MG 24 hr capsule TAKE 1 CAPSULE (180 MG TOTAL) BY MOUTH ONCE DAILY     MULTIPLE VITAMINS PO Take 1 tablet by mouth daily.     Omega-3 Fatty Acids (FISH OIL) 1000 MG CAPS Take by mouth.     pantoprazole (PROTONIX) 40 MG tablet TAKE 1 TABLET (40 MG TOTAL) BY MOUTH ONCE DAILY TAKE 15-20 MINS BEFORE MEAL  Zinc Sulfate (ZINC 15 PO) Take by mouth.     Cholecalciferol 25 MCG (1000 UT) tablet Take 1,000 Units by mouth daily. (Patient not taking: Reported on 02/15/2023)     magnesium oxide (MAG-OX) 400 MG tablet Take 400 mg by mouth daily. (Patient not taking: Reported on 02/15/2023)     metoprolol tartrate (LOPRESSOR) 25 MG tablet Take 1 tablet (25 mg total) by mouth 2 (two) times daily as needed. For PVCs/palpitations (Patient  not taking: Reported on 01/28/2023) 180 tablet 3   No current facility-administered medications for this visit.    Allergies:   Patient has no known allergies.   Social History:  The patient  reports that he has never smoked. He has quit using smokeless tobacco. He reports that he does not drink alcohol and does not use drugs.   Family History:   family history includes Heart disease in his father; Hyperlipidemia in his mother.   Review of Systems: Review of Systems  Constitutional: Negative.   HENT: Negative.    Respiratory: Negative.    Cardiovascular: Negative.   Gastrointestinal: Negative.   Musculoskeletal: Negative.   Neurological: Negative.   Psychiatric/Behavioral: Negative.    All other systems reviewed and are negative.   PHYSICAL EXAM: VS:  BP 122/70 (BP Location: Left Arm, Patient Position: Sitting, Cuff Size: Large)   Pulse 70   Ht 6' (1.829 m)   Wt 238 lb 12.8 oz (108.3 kg)   SpO2 96%   BMI 32.39 kg/m  , BMI Body mass index is 32.39 kg/m. Constitutional:  oriented to person, place, and time. No distress.  HENT:  Head: Grossly normal Eyes:  no discharge. No scleral icterus.  Neck: No JVD, no carotid bruits  Cardiovascular: Regular rate and rhythm, no murmurs appreciated Pulmonary/Chest: Clear to auscultation bilaterally, no wheezes or rails Abdominal: Soft.  no distension.  no tenderness.  Musculoskeletal: Normal range of motion Neurological:  normal muscle tone. Coordination normal. No atrophy Skin: Skin warm and dry Psychiatric: normal affect, pleasant   Recent Labs: 01/21/2023: ALT 19; BUN 16; Creatinine, Ser 0.96; Hemoglobin 15.4; Platelets 216; Potassium 4.1; Sodium 142 01/28/2023: TSH 3.930    Lipid Panel Lab Results  Component Value Date   CHOL 138 01/21/2023   HDL 41 01/21/2023   LDLCALC 77 01/21/2023   TRIG 110 01/21/2023      Wt Readings from Last 3 Encounters:  07/13/23 238 lb 12.8 oz (108.3 kg)  01/28/23 230 lb (104.3 kg)   09/03/22 235 lb (106.6 kg)     ASSESSMENT AND PLAN:  Problem List Items Addressed This Visit       Cardiology Problems   PAF (paroxysmal atrial fibrillation) (HCC) - Primary   Relevant Orders   EKG 12-Lead (Completed)   Hyperlipidemia   Other Visit Diagnoses     Palpitations          Paroxysmal atrial fibrillation [place] fib 2019, low CHADS VASC, not on anticoagulation On diltiazem er 180 mg, rhythm controlled  metoprolol as needed  Premature ventricular contractions, symptomatic Rare episodes of PVCs Improved with diltiazem daily, also has metoprolol as needed September 2022, went to the emergency room, PVCs documented on EKG Reports he has been asymptomatic  Hyperlipidemia Cholesterol is at goal on the current lipid regimen. No changes to the medications were made. Minimal aortic atherosclerosis on CT scan, images reviewed   Total encounter time more than 30 minutes  Greater than 50% was spent in counseling and coordination of care with the  patient    Signed, Dossie Arbour, M.D., Ph.D. [facility] [place], [place] [phone]

## 2023-07-13 ENCOUNTER — Ambulatory Visit: Payer: 59 | Attending: Cardiovascular Disease | Admitting: Cardiovascular Disease

## 2023-07-13 ENCOUNTER — Encounter: Payer: Self-pay | Admitting: Cardiovascular Disease

## 2023-07-13 VITALS — BP 122/70 | HR 70 | Ht 72.0 in | Wt 238.8 lb

## 2023-07-13 DIAGNOSIS — R002 Palpitations: Secondary | ICD-10-CM

## 2023-07-13 DIAGNOSIS — E782 Mixed hyperlipidemia: Secondary | ICD-10-CM

## 2023-07-13 DIAGNOSIS — I480 Paroxysmal atrial fibrillation: Secondary | ICD-10-CM | POA: Diagnosis not present

## 2023-07-13 MED ORDER — ATORVASTATIN CALCIUM 10 MG PO TABS: 10.0000 mg | ORAL_TABLET | Freq: Every day | ORAL | 3 refills | Status: DC

## 2023-07-13 NOTE — Patient Instructions (Signed)

## 2023-07-14 DIAGNOSIS — Z96642 Presence of left artificial hip joint: Secondary | ICD-10-CM | POA: Diagnosis not present

## 2023-07-14 DIAGNOSIS — M2580 Other specified joint disorders, unspecified joint: Secondary | ICD-10-CM | POA: Diagnosis not present

## 2023-07-14 DIAGNOSIS — Z96641 Presence of right artificial hip joint: Secondary | ICD-10-CM | POA: Diagnosis not present

## 2023-08-05 DIAGNOSIS — D485 Neoplasm of uncertain behavior of skin: Secondary | ICD-10-CM | POA: Diagnosis not present

## 2023-08-05 DIAGNOSIS — Z85828 Personal history of other malignant neoplasm of skin: Secondary | ICD-10-CM | POA: Diagnosis not present

## 2023-08-05 DIAGNOSIS — Z08 Encounter for follow-up examination after completed treatment for malignant neoplasm: Secondary | ICD-10-CM | POA: Diagnosis not present

## 2023-08-05 DIAGNOSIS — D2272 Melanocytic nevi of left lower limb, including hip: Secondary | ICD-10-CM | POA: Diagnosis not present

## 2023-08-05 DIAGNOSIS — L538 Other specified erythematous conditions: Secondary | ICD-10-CM | POA: Diagnosis not present

## 2023-08-05 DIAGNOSIS — D225 Melanocytic nevi of trunk: Secondary | ICD-10-CM | POA: Diagnosis not present

## 2023-08-05 DIAGNOSIS — L821 Other seborrheic keratosis: Secondary | ICD-10-CM | POA: Diagnosis not present

## 2023-08-05 DIAGNOSIS — D2262 Melanocytic nevi of left upper limb, including shoulder: Secondary | ICD-10-CM | POA: Diagnosis not present

## 2023-08-05 DIAGNOSIS — D2261 Melanocytic nevi of right upper limb, including shoulder: Secondary | ICD-10-CM | POA: Diagnosis not present

## 2023-08-05 DIAGNOSIS — D2271 Melanocytic nevi of right lower limb, including hip: Secondary | ICD-10-CM | POA: Diagnosis not present

## 2023-08-09 ENCOUNTER — Other Ambulatory Visit: Payer: Self-pay

## 2023-08-09 MED ORDER — VALACYCLOVIR HCL 500 MG PO TABS: 500.0000 mg | ORAL_TABLET | Freq: Two times a day (BID) | ORAL | 1 refills | Status: AC

## 2023-08-12 DIAGNOSIS — D045 Carcinoma in situ of skin of trunk: Secondary | ICD-10-CM | POA: Diagnosis not present

## 2023-10-02 ENCOUNTER — Other Ambulatory Visit: Payer: Self-pay | Admitting: Cardiovascular Disease

## 2024-01-18 ENCOUNTER — Ambulatory Visit: Payer: 59

## 2024-01-18 DIAGNOSIS — Z0000 Encounter for general adult medical examination without abnormal findings: Secondary | ICD-10-CM

## 2024-01-18 LAB — POCT URINALYSIS DIPSTICK
Bilirubin, UA: NEGATIVE
Blood, UA: NEGATIVE
Glucose, UA: NEGATIVE
Ketones, UA: NEGATIVE
Leukocytes, UA: NEGATIVE
Nitrite, UA: NEGATIVE
Protein, UA: POSITIVE — AB
Spec Grav, UA: 1.015 (ref 1.010–1.025)
Urobilinogen, UA: 0.2 U/dL
pH, UA: 7 (ref 5.0–8.0)

## 2024-01-18 NOTE — Progress Notes (Signed)
 Pt presents today to complete physical labs. Jesus Kent

## 2024-01-19 LAB — CMP12+LP+TP+TSH+6AC+PSA+CBC…
ALT: 22 IU/L (ref 0–44)
AST: 23 IU/L (ref 0–40)
Albumin: 4.6 g/dL (ref 3.9–4.9)
Alkaline Phosphatase: 84 IU/L (ref 44–121)
BUN/Creatinine Ratio: 16 (ref 10–24)
BUN: 15 mg/dL (ref 8–27)
Basophils Absolute: 0.1 10*3/uL (ref 0.0–0.2)
Basos: 1 %
Bilirubin Total: 0.5 mg/dL (ref 0.0–1.2)
Calcium: 9.1 mg/dL (ref 8.6–10.2)
Chloride: 104 mmol/L (ref 96–106)
Chol/HDL Ratio: 3.6 ratio (ref 0.0–5.0)
Cholesterol, Total: 150 mg/dL (ref 100–199)
Creatinine, Ser: 0.95 mg/dL (ref 0.76–1.27)
EOS (ABSOLUTE): 0.3 10*3/uL (ref 0.0–0.4)
Eos: 6 %
Estimated CHD Risk: 0.6 times avg. (ref 0.0–1.0)
Free Thyroxine Index: 2 (ref 1.2–4.9)
GGT: 15 IU/L (ref 0–65)
Globulin, Total: 2.6 g/dL (ref 1.5–4.5)
Glucose: 88 mg/dL (ref 70–99)
HDL: 42 mg/dL (ref >39)
Hematocrit: 46.5 % (ref 37.5–51.0)
Hemoglobin: 16 g/dL (ref 13.0–17.7)
Immature Grans (Abs): 0 10*3/uL (ref 0.0–0.1)
Immature Granulocytes: 0 %
Iron: 94 ug/dL (ref 38–169)
LDH: 222 IU/L (ref 121–224)
LDL Chol Calc (NIH): 91 mg/dL (ref 0–99)
Lymphocytes Absolute: 2 10*3/uL (ref 0.7–3.1)
Lymphs: 36 %
MCH: 31.1 pg (ref 26.6–33.0)
MCHC: 34.4 g/dL (ref 31.5–35.7)
MCV: 91 fL (ref 79–97)
Monocytes Absolute: 0.6 10*3/uL (ref 0.1–0.9)
Monocytes: 10 %
Neutrophils Absolute: 2.6 10*3/uL (ref 1.4–7.0)
Neutrophils: 47 %
Phosphorus: 3.2 mg/dL (ref 2.8–4.1)
Platelets: 193 10*3/uL (ref 150–450)
Potassium: 4.3 mmol/L (ref 3.5–5.2)
Prostate Specific Ag, Serum: 1 ng/mL (ref 0.0–4.0)
RBC: 5.14 x10E6/uL (ref 4.14–5.80)
RDW: 12.7 % (ref 11.6–15.4)
Sodium: 141 mmol/L (ref 134–144)
T3 Uptake Ratio: 26 % (ref 24–39)
T4, Total: 7.8 ug/dL (ref 4.5–12.0)
TSH: 2.71 u[IU]/mL (ref 0.450–4.500)
Total Protein: 7.2 g/dL (ref 6.0–8.5)
Triglycerides: 88 mg/dL (ref 0–149)
Uric Acid: 5.5 mg/dL (ref 3.8–8.4)
VLDL Cholesterol Cal: 17 mg/dL (ref 5–40)
WBC: 5.5 10*3/uL (ref 3.4–10.8)
eGFR: 91 mL/min/{1.73_m2} (ref >59)

## 2024-01-25 ENCOUNTER — Ambulatory Visit: Payer: 59 | Admitting: Physician Assistant

## 2024-01-25 ENCOUNTER — Encounter: Payer: Self-pay | Admitting: Physician Assistant

## 2024-01-25 VITALS — BP 117/76 | HR 70 | Resp 14 | Ht 72.0 in | Wt 225.0 lb

## 2024-01-25 DIAGNOSIS — R808 Other proteinuria: Secondary | ICD-10-CM

## 2024-01-25 DIAGNOSIS — Z0000 Encounter for general adult medical examination without abnormal findings: Secondary | ICD-10-CM

## 2024-01-25 LAB — POCT URINALYSIS DIPSTICK
Blood, UA: NEGATIVE
Glucose, UA: NEGATIVE
Ketones, UA: NEGATIVE
Leukocytes, UA: NEGATIVE
Nitrite, UA: NEGATIVE
Protein, UA: POSITIVE — AB
Spec Grav, UA: 1.02 (ref 1.010–1.025)
Urobilinogen, UA: 0.2 U/dL
pH, UA: 6 (ref 5.0–8.0)

## 2024-01-25 NOTE — Progress Notes (Signed)
 [facility] [place] ____________________________________________   None    (approximate)  I have reviewed the triage vital signs and the nursing notes.   HISTORY  Chief Complaint No chief complaint on file.   HPI Jesus Kent is a 63 y.o. male patient presents for annual physical exam.  Patient voiced no concerns or complaints.         Past Medical History:  Diagnosis Date   Atrial fibrillation (HCC)    Herpes simplex type 2 infection    [facility]   Hyperlipidemia     Patient Active Problem List   Diagnosis Date Noted   Pain in joint of right knee 08/26/2021   Arthritis of right knee 08/26/2021   Arthritis of left elbow 07/04/2021   Diverticulitis of colon 06/2008 08/13/2020   Seasonal rhinitis 08/13/2020   GERD (gastroesophageal reflux disease) 08/13/2020   PAF (paroxysmal atrial fibrillation) (HCC) 12/10/2019   Hyperlipidemia 12/10/2019   Bradycardia 07/20/2018   Pain in joint of left elbow 10/07/2017   History of right hip replacement 05/28/2017   Obesity (BMI 30-39.9) 04/20/2017   Follow-up examination after orthopedic surgery 03/16/2017   Osteoarthritis of knee 09/04/2013   Ulnar nerve entrapment at elbow 09/04/2013   Heterotopic ossification of bone 04/06/2013    Past Surgical History:  Procedure Laterality Date   COLONOSCOPY WITH PROPOFOL  N/A 05/26/2016   Procedure: COLONOSCOPY WITH PROPOFOL ;  Surgeon: Deveron Fly, MD;  Location: [facility] ENDOSCOPY;  Service: Endoscopy;  Laterality: N/A;   ELBOW SURGERY Left    surgery to elbow left x 2   JOINT REPLACEMENT     knee surgery x3 Right    TOTAL HIP ARTHROPLASTY      Prior to Admission medications   Medication Sig Start Date End Date Taking? Authorizing Provider  atorvastatin  (LIPITOR) 10 MG tablet Take 1 tablet (10 mg total) by mouth daily. 07/13/23   Gollan, Timothy J, MD  celecoxib (CELEBREX) 200 MG capsule Take 200 mg by mouth daily.    [provider]  Cholecalciferol 25 MCG (1000 UT) tablet Take 1,000 Units by mouth daily. Patient not taking: Reported on 02/15/2023    [provider]  diltiazem  (CARDIZEM  CD) 180 MG 24 hr capsule TAKE 1 CAPSULE BY MOUTH EVERY DAY 10/04/23   Gollan, Timothy J, MD  magnesium  oxide (MAG-OX) 400 MG tablet Take 400 mg by mouth daily. Patient not taking: Reported on 02/15/2023    [provider]  metoprolol  tartrate (LOPRESSOR ) 25 MG tablet Take 1 tablet (25 mg total) by mouth 2 (two) times daily as needed. For PVCs/palpitations Patient not taking: Reported on 01/28/2023 07/09/21   Gollan, Timothy J, MD  MULTIPLE VITAMINS PO Take 1 tablet by mouth daily.    [provider]  Omega-3 Fatty Acids (FISH OIL) 1000 MG CAPS Take by mouth.    [provider]  pantoprazole (PROTONIX) 40 MG tablet TAKE 1 TABLET (40 MG TOTAL) BY MOUTH ONCE DAILY TAKE 15-20 MINS BEFORE MEAL    [provider]  Zinc Sulfate (ZINC 15 PO) Take by mouth.    [provider]    Allergies Patient has no known allergies.  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 ENT: No sore throat. Cardiovascular: Denies chest pain.  PAF Respiratory: Denies shortness of breath. Gastrointestinal: No abdominal pain.  No nausea, no vomiting.  No diarrhea.  No constipation.  GERD. Genitourinary: Negative for dysuria. Musculoskeletal: Negative for back pain. Skin: Negative for rash. Neurological: Negative for headaches, focal weakness or numbness.  Endocrine:  Hyperlipidemia.  ____________________________________________   PHYSICAL EXAM:  VITAL SIGNS:BP117/76Cuff SizeNormalPulse Rate70Weight225 lb (102.1 kg)Height6' (1.829 m)Resp14SpO294 % BMI: 30.52 kg/m2  BSA: 2.28 m2   Constitutional: Alert and oriented. Well appearing and in no acute distress. Eyes: Conjunctivae are normal. PERRL. EOMI. Head: Atraumatic. Nose: No congestion/rhinnorhea. Mouth/Throat: Mucous membranes are moist.  Oropharynx non-erythematous. Neck: No stridor.  No cervical spine tenderness to palpation. Hematological/Lymphatic/Immunilogical: No cervical lymphadenopathy. Cardiovascular: Normal rate, regular rhythm. Grossly normal heart sounds.  Good peripheral circulation. Respiratory: Normal respiratory effort.  No retractions. Lungs CTAB. Gastrointestinal: Soft and nontender. No distention. No abdominal bruits. No CVA tenderness. Genitourinary: Deferred Musculoskeletal: No lower extremity tenderness nor edema.  No joint effusions. Neurologic:  Normal speech and language. No gross focal neurologic deficits are appreciated. No gait instability. Skin:  Skin is warm, dry and intact. No rash noted. Psychiatric: Mood and affect are normal. Speech and behavior are normal.  ____________________________________________   LABS         Component Ref Range & Units (hover) 7 d ago (01/18/24) 1 yr ago (01/21/23) 1 yr ago (02/03/22) 2 yr ago (02/21/21) 3 yr ago (03/28/20)  Color, UA yellow yellow dark yellow Yellow Yellow  Clarity, UA clear clear cloudy Clear Clear  Glucose, UA Negative Negative Negative Negative Negative  Bilirubin, UA neg neg negative Negative Negative  Ketones, UA neg neg negative Negative Negative  Spec Grav, UA 1.015 1.010 1.025 1.015 1.025  Blood, UA neg neg trace CM Negative Negative  pH, UA 7.0 6.0 6.0 7.0 6.0  Protein, UA Positive Abnormal  Negative Negative Negative Negative  Comment: trace -+  Urobilinogen, UA 0.2 0.2 0.2 0.2 0.2  Nitrite, UA neg  neg negative Negative Negative  Leukocytes, UA Negative Negative Negative Negative Negative  Appearance   scant urine    Odor                  View All Conversations on this Encounter                Component Ref Range & Units (hover) 7 d ago (01/18/24) 12 mo ago (01/28/23) 12 mo ago (01/28/23) 1 yr ago (01/21/23) 1 yr ago (06/03/22) 1 yr ago (02/03/22) 2 yr ago (06/11/21) 2 yr ago (06/11/21)  Glucose 88   81  92 97 CM   Uric Acid 5.5   5.2 CM  5.7 CM    Comment:            Therapeutic target for gout patients: <6.0  BUN 15   16  16  R 19 R   Creatinine, Ser 0.95   0.96 1.00 R 1.00 1.06 R   eGFR 91   90  87    BUN/Creatinine Ratio 16   17  16  R    Sodium 141   142  142 139 R   Potassium 4.3   4.1  4.2 4.0 R   Chloride 104   105  105 106  R   Calcium  9.1   9.3  9.5 R 9.2 R   Phosphorus 3.2   3.6  3.9    Total Protein 7.2   6.6  7.4    Albumin 4.6   4.2 R  4.7 R    Globulin, Total 2.6   2.4  2.7    Bilirubin Total 0.5   0.4  0.6    Alkaline Phosphatase 84   77  75    LDH 222   202  222    AST 23   22  22     ALT 22   19  22     GGT 15   12  16     Iron 94   89  84    Cholesterol, Total 150   138  156    Triglycerides 88   110  119    HDL 42   41  46    VLDL Cholesterol Cal 17   20  21     LDL Chol Calc (NIH) 91   77  89    Chol/HDL Ratio 3.6   3.4 CM  3.4 CM    Comment:                                   T. Chol/HDL Ratio                                             Men  Women                               1/2 Avg.Risk  3.4    3.3                                   Avg.Risk  5.0    4.4                                2X Avg.Risk  9.6    7.1                                3X Avg.Risk 23.4   11.0  Estimated CHD Risk 0.6   0.5 CM  0.5 CM    Comment: The CHD Risk is based on the T. Chol/HDL ratio. Other factors affect CHD Risk such as hypertension, smoking, diabetes, severe obesity, and family history of premature CHD.  TSH 2.710  3.930 5.200 High   2.800    T4, Total 7.8   7.0   9.0    T3 Uptake Ratio 26   23 Low   25    Free Thyroxine Index 2.0   1.6  2.3    Prostate Specific Ag, Serum 1.0 1.1 CM  1.7 CM  0.6 CM    Comment: Roche ECLIA methodology. According to the American Urological Association, Serum PSA should decrease and remain at undetectable levels after radical prostatectomy. The AUA defines biochemical recurrence as an initial PSA value 0.2 ng/mL or greater followed by a subsequent confirmatory PSA value 0.2 ng/mL or greater. Values obtained  with different assay methods or kits cannot be used interchangeably. Results cannot be interpreted as absolute evidence of the presence or absence of malignant disease.  WBC 5.5   5.3  6.3  9.7 R  RBC 5.14   4.99  5.39  5.10 R  Hemoglobin 16.0   15.4  16.6  16.0 R  Hematocrit 46.5   44.4  47.4  44.9 R  MCV 91   89  88  88.0 R  MCH 31.1   30.9  30.8  31.4 R  MCHC 34.4   34.7  35.0  35.6 R  RDW 12.7   12.7  12.9  12.9 R  Platelets 193   216  203  193 R  Neutrophils 47   51  51    Lymphs 36   36  37    Monocytes 10   10  9     Eos 6   2  2     Basos 1   1  1     Neutrophils Absolute 2.6   2.7  3.2    Lymphocytes Absolute 2.0   1.9  2.4    Monocytes Absolute 0.6   0.6  0.6    EOS (ABSOLUTE) 0.3   0.1  0.1    Basophils Absolute 0.1   0.1  0.0    Immature Granulocytes 0   0  0    Immature Grans (Abs) 0.0   0.0  0.0                ____________________________________________  EKG  Sinus rhythm at 64 bpm ____________________________________________    ____________________________________________   INITIAL IMPRESSION / ASSESSMENT AND PLAN  As part of my medical decision making, I reviewed the following data within the electronic MEDICAL RECORD NUMBER       No acute findings on physical exam, EKG, labs.     ____________________________________________   FINAL CLINICAL IMPRESSION   Well exam ED Discharge Orders     None        Note:  This document was prepared using Dragon voice  recognition software and may include unintentional dictation errors.

## 2024-01-25 NOTE — Progress Notes (Signed)
 Pt presents today to complete physical, Pt concerned with protein in urine. Having him recheck before completing physical. Jesus Kent

## 2024-03-27 ENCOUNTER — Encounter: Payer: Self-pay | Admitting: Cardiovascular Disease

## 2024-03-27 DIAGNOSIS — E782 Mixed hyperlipidemia: Secondary | ICD-10-CM

## 2024-03-29 ENCOUNTER — Ambulatory Visit
Admission: RE | Admit: 2024-03-29 | Discharge: 2024-03-29 | Disposition: A | Discharge status: Home / Self Care | Payer: Self-pay | Source: Ambulatory Visit | Attending: Physician Assistant | Admitting: Physician Assistant

## 2024-03-29 DIAGNOSIS — E782 Mixed hyperlipidemia: Secondary | ICD-10-CM | POA: Insufficient documentation

## 2024-04-02 ENCOUNTER — Ambulatory Visit: Payer: Self-pay | Admitting: Cardiovascular Disease

## 2024-04-03 ENCOUNTER — Other Ambulatory Visit: Payer: Self-pay | Admitting: Emergency Medicine

## 2024-04-03 MED ORDER — ATORVASTATIN CALCIUM 20 MG PO TABS: 20.0000 mg | ORAL_TABLET | Freq: Every day | ORAL | 3 refills | Status: DC

## 2024-06-28 ENCOUNTER — Encounter: Payer: Self-pay | Admitting: Cardiovascular Disease

## 2024-06-28 MED ORDER — ATORVASTATIN CALCIUM 20 MG PO TABS: 20.0000 mg | ORAL_TABLET | Freq: Every day | ORAL | 3 refills | Status: AC

## 2024-07-17 DIAGNOSIS — M1711 Unilateral primary osteoarthritis, right knee: Secondary | ICD-10-CM | POA: Diagnosis not present

## 2024-07-17 DIAGNOSIS — Z860101 Personal history of adenomatous and serrated colon polyps: Secondary | ICD-10-CM | POA: Diagnosis not present

## 2024-07-17 DIAGNOSIS — K219 Gastro-esophageal reflux disease without esophagitis: Secondary | ICD-10-CM | POA: Diagnosis not present

## 2024-07-17 DIAGNOSIS — Z8719 Personal history of other diseases of the digestive system: Secondary | ICD-10-CM | POA: Diagnosis not present

## 2024-07-31 ENCOUNTER — Telehealth: Payer: Self-pay

## 2024-07-31 NOTE — Telephone Encounter (Signed)
 Jesus Kent

## 2024-08-07 DIAGNOSIS — D485 Neoplasm of uncertain behavior of skin: Secondary | ICD-10-CM | POA: Diagnosis not present

## 2024-08-07 DIAGNOSIS — D2261 Melanocytic nevi of right upper limb, including shoulder: Secondary | ICD-10-CM | POA: Diagnosis not present

## 2024-08-07 DIAGNOSIS — D2262 Melanocytic nevi of left upper limb, including shoulder: Secondary | ICD-10-CM | POA: Diagnosis not present

## 2024-08-07 DIAGNOSIS — D2272 Melanocytic nevi of left lower limb, including hip: Secondary | ICD-10-CM | POA: Diagnosis not present

## 2024-08-07 DIAGNOSIS — C44612 Basal cell carcinoma of skin of right upper limb, including shoulder: Secondary | ICD-10-CM | POA: Diagnosis not present

## 2024-08-07 DIAGNOSIS — D2271 Melanocytic nevi of right lower limb, including hip: Secondary | ICD-10-CM | POA: Diagnosis not present

## 2024-08-07 DIAGNOSIS — Z85828 Personal history of other malignant neoplasm of skin: Secondary | ICD-10-CM | POA: Diagnosis not present

## 2024-08-08 ENCOUNTER — Ambulatory Visit: Admitting: Cardiovascular Disease

## 2024-08-11 NOTE — Progress Notes (Signed)
 Cardiology Office Note  Date:  08/14/2024   ID:  Jesus Kent, DOB 11-28-1960, MRN [id number]  PCP:  Patient, No Pcp Per   Chief Complaint  Patient presents with   Follow-up    12 month follow up. Pt has been doing well with no complaints of chest pain, chest pressure or SOB, medication reviewed verbally with patient.    HPI:  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,  Denies chest pain or shortness of breath on exertion Rare symptoms from PVCs, takes metoprolol  as needed Denies any persistent tachycardia concerning for atrial fibrillation Last episode 2019  Total cholesterol 150 range LDL 91 on Lipitor 10 Dose was increased up to 20 mg daily  CT calcium  score 2024 score 24 Images pulled up and reviewed, 2 punctate spots of calcium  in the LAD, no calcium  in the other vessels Small amount of calcium  on the aortic valve  CT abdomen  Minimal aortic atherosclerosis on CT scan Previously seen in 2022  EKG personally reviewed by myself on todays visit EKG Interpretation Date/Time:  Monday August 14 2024 08:10:34 EST Ventricular Rate:  58 PR Interval:  156 QRS Duration:  104 QT Interval:  424 QTC Calculation: 416 R Axis:   -33  Text Interpretation: Sinus bradycardia When compared with ECG of 13-Jul-2023 08:11, No significant change was found Confirmed by Perla Lye [phone]) on 08/14/2024 8:15:30 AM    Past medical history reviewed Seen in ER 06/11/2021 for symptomatic palpitations,  developed that evening and persisted until 6 AM Work-up in the emergency room reviewed showing PVCs on EKG Lab work normal Symptoms resolved on their own   Lone atrial fib 2019 Went to ER, started on medication for rate control, converted to normal sinus rhythm echocardiogram stress test, both essentially normal  Echo 01/2018 NORMAL LEFT VENTRICULAR SYSTOLIC FUNCTION WITH AN ESTIMATED EF = 55 % NORMAL RIGHT VENTRICULAR SYSTOLIC FUNCTION MILD TRICUSPID AND MITRAL VALVE INSUFFICIENCY NO VALVULAR STENOSIS MILD BIATRIAL ENLARGEMENT   PMH:   has a past medical history of Atrial fibrillation (HCC), Herpes simplex type 2 infection, and Hyperlipidemia.  PSH:    Past Surgical History:  Procedure Laterality Date   COLONOSCOPY WITH PROPOFOL  N/A 05/26/2016   Procedure: COLONOSCOPY WITH PROPOFOL ;  Surgeon: Gladis RAYMOND Mariner, MD;  Location: [facility] ENDOSCOPY;  Service: Endoscopy;  Laterality: N/A;   ELBOW SURGERY Left    surgery to elbow left x 2   JOINT REPLACEMENT     knee surgery x3 Right    TOTAL HIP ARTHROPLASTY      Current Outpatient Medications  Medication Sig Dispense Refill   atorvastatin  (LIPITOR) 20 MG tablet Take 1 tablet (20 mg total) by mouth daily. 90 tablet 3   celecoxib (CELEBREX) 200 MG capsule Take 200 mg by mouth daily.     Cholecalciferol 25 MCG (1000 UT) tablet Take 1,000 Units by mouth daily.     magnesium  oxide (MAG-OX) 400 MG tablet Take 400 mg by mouth daily.     metoprolol  tartrate (LOPRESSOR ) 25 MG tablet Take 1 tablet (25  mg total) by mouth 2 (two) times daily as needed. 60 tablet 3   MULTIPLE VITAMINS PO Take 1 tablet by mouth daily.     Omega-3 Fatty Acids (FISH OIL) 1000 MG CAPS Take by mouth.     pantoprazole (PROTONIX) 40 MG tablet TAKE 1 TABLET (40 MG TOTAL) BY MOUTH ONCE DAILY TAKE 15-20 MINS BEFORE MEAL     Zinc Sulfate (ZINC 15 PO) Take by mouth.     diltiazem  (CARDIZEM  CD) 180 MG 24 hr capsule Take 1 capsule (180 mg total) by mouth daily. 90 capsule 3    No current facility-administered medications for this visit.    Allergies:   Patient has no known allergies.   Social History:  The patient  reports that he has never smoked. He has quit using smokeless tobacco. He reports that he does not drink alcohol and does not use drugs.   Family History:   family history includes Heart disease in his father; Hyperlipidemia in his mother.   Review of Systems: Review of Systems  Constitutional: Negative.   HENT: Negative.    Respiratory: Negative.    Cardiovascular: Negative.   Gastrointestinal: Negative.   Musculoskeletal: Negative.   Neurological: Negative.   Psychiatric/Behavioral: Negative.    All other systems reviewed and are negative.   PHYSICAL EXAM: VS:  BP 112/78 (BP Location: Left Arm, Patient Position: Sitting, Cuff Size: Normal)   Pulse (!) 58   Ht 6' (1.829 m)   Wt 222 lb (100.7 kg)   SpO2 95%   BMI 30.11 kg/m  , BMI Body mass index is 30.11 kg/m. Constitutional:  oriented to person, place, and time. No distress.  HENT:  Head: Normocephalic and atraumatic.  Eyes:  no discharge. No scleral icterus.  Neck: Normal range of motion. Neck supple. No JVD present.  Cardiovascular: Normal rate, regular rhythm, normal heart sounds and intact distal pulses. Exam reveals no gallop and no friction rub. No edema No murmur heard. Pulmonary/Chest: Effort normal and breath sounds normal. No stridor. No respiratory distress.  no wheezes.  no rales.  no tenderness.  Abdominal: Soft.  no distension.  no tenderness.  Musculoskeletal: Normal range of motion.  no  tenderness or deformity.  Neurological:  normal muscle tone. Coordination normal. No atrophy Skin: Skin is warm and dry. No rash noted. not diaphoretic.  Psychiatric:  normal mood and affect. behavior is normal. Thought content normal.   Recent Labs: 01/18/2024: ALT 22; BUN 15; Creatinine, Ser 0.95; Hemoglobin 16.0; Platelets 193; Potassium 4.3; Sodium 141; TSH 2.710    Lipid  Panel Lab Results  Component Value Date   CHOL 150 01/18/2024   HDL 42 01/18/2024   LDLCALC 91 01/18/2024   TRIG 88 01/18/2024      Wt Readings from Last 3 Encounters:  08/14/24 222 lb (100.7 kg)  01/25/24 225 lb (102.1 kg)  07/13/23 238 lb 12.8 oz (108.3 kg)     ASSESSMENT AND PLAN:  Problem List Items Addressed This Visit       Cardiology Problems   PAF (paroxysmal atrial fibrillation) (HCC) - Primary   Relevant Medications   metoprolol  tartrate (LOPRESSOR ) 25 MG tablet   diltiazem  (CARDIZEM  CD) 180 MG 24 hr capsule   Other Relevant Orders   EKG 12-Lead (Completed)   Hyperlipidemia   Relevant Medications   metoprolol  tartrate (LOPRESSOR ) 25 MG tablet   diltiazem  (CARDIZEM  CD) 180 MG 24 hr capsule   Other Relevant Orders   EKG 12-Lead (Completed)  Other   Bradycardia   Relevant Orders   EKG 12-Lead (Completed)   Other Visit Diagnoses       Palpitations       Relevant Orders   EKG 12-Lead (Completed)       Paroxysmal atrial fibrillation [place] fib 2019, low CHADS VASC, not on anticoagulation On diltiazem  er 180 mg, rhythm controlled Has metoprolol  as needed but has not had to take it  Premature ventricular contractions, symptomatic Rare episodes of PVCs Improved with diltiazem  ER 180 daily As metoprolol  to tartrate 25 twice daily as needed for breakthrough PVCs September 2022, went to the emergency room, PVCs documented on EKG  Hyperlipidemia Tolerating Lipitor 20 daily Goal LDL less than 70 preferably close to 55 As lab work done on an annual or biannual basis which she will share with us  when available If needed could add Zetia 10 mg daily   Signed, Tim Keyasha Miah, M.D., Ph.D. [facility] [place], [place] [phone]

## 2024-08-14 ENCOUNTER — Encounter: Payer: Self-pay | Admitting: Cardiovascular Disease

## 2024-08-14 ENCOUNTER — Ambulatory Visit: Attending: Cardiovascular Disease | Admitting: Cardiovascular Disease

## 2024-08-14 ENCOUNTER — Other Ambulatory Visit: Payer: Self-pay

## 2024-08-14 VITALS — BP 112/78 | HR 58 | Ht 72.0 in | Wt 222.0 lb

## 2024-08-14 DIAGNOSIS — E782 Mixed hyperlipidemia: Secondary | ICD-10-CM

## 2024-08-14 DIAGNOSIS — I480 Paroxysmal atrial fibrillation: Secondary | ICD-10-CM | POA: Diagnosis not present

## 2024-08-14 DIAGNOSIS — R001 Bradycardia, unspecified: Secondary | ICD-10-CM

## 2024-08-14 DIAGNOSIS — R002 Palpitations: Secondary | ICD-10-CM

## 2024-08-14 MED ORDER — DILTIAZEM HCL ER COATED BEADS 180 MG PO CP24: 180.0000 mg | ORAL_CAPSULE | Freq: Every day | ORAL | 3 refills | Status: AC

## 2024-08-14 MED ORDER — METOPROLOL TARTRATE 25 MG PO TABS: 25.0000 mg | ORAL_TABLET | Freq: Two times a day (BID) | ORAL | 3 refills | Status: AC | PRN

## 2024-08-14 NOTE — Patient Instructions (Addendum)
 Goal total chol <140 Goal LDL <70 (aiming for 55)  Medication Instructions:  No changes  If you need a refill on your cardiac medications before your next appointment, please call your pharmacy.   Lab work: No new labs needed  Testing/Procedures: No new testing needed  Follow-Up: At [facility], you and your health needs are our priority.  As part of our continuing mission to provide you with exceptional heart care, we have created designated Provider Care Teams.  These Care Teams include your primary Cardiologist (physician) and Advanced Practice Providers (APPs -  Physician Assistants and Nurse Practitioners) who all work together to provide you with the care you need, when you need it.  You will need a follow up appointment in 12 months  Providers on your designated Care Team:   Lonni Meager, NP Bernardino Bring, PA-C Cadence [place], [place]  COVID-19 Vaccine Information can be found at: [email or web address] For questions related to vaccine distribution or appointments, please email [email or web address] or call [phone].

## 2024-08-21 DIAGNOSIS — M1711 Unilateral primary osteoarthritis, right knee: Secondary | ICD-10-CM | POA: Diagnosis not present

## 2024-08-30 ENCOUNTER — Other Ambulatory Visit: Payer: Self-pay | Admitting: Cardiovascular Disease

## 2024-08-30 DIAGNOSIS — I480 Paroxysmal atrial fibrillation: Secondary | ICD-10-CM

## 2024-08-30 DIAGNOSIS — R002 Palpitations: Secondary | ICD-10-CM

## 2024-08-30 DIAGNOSIS — E782 Mixed hyperlipidemia: Secondary | ICD-10-CM

## 2024-09-08 DIAGNOSIS — C44612 Basal cell carcinoma of skin of right upper limb, including shoulder: Secondary | ICD-10-CM | POA: Diagnosis not present
